# Patient Record
Sex: Female | Born: 1935 | Race: White | Hispanic: No | Smoking: Never smoker
Health system: Southern US, Community
[De-identification: ages and names within clinical notes are randomized; demographics above are authoritative.]

## PROBLEM LIST (undated history)

## (undated) DIAGNOSIS — E079 Disorder of thyroid, unspecified: Secondary | ICD-10-CM

---

## 2004-11-03 ENCOUNTER — Other Ambulatory Visit: Payer: Self-pay

## 2004-11-03 ENCOUNTER — Emergency Department: Payer: Self-pay | Admitting: Emergency Medicine

## 2005-06-29 ENCOUNTER — Inpatient Hospital Stay: Payer: Self-pay | Admitting: Internal Medicine

## 2005-06-29 ENCOUNTER — Other Ambulatory Visit: Payer: Self-pay

## 2005-07-02 ENCOUNTER — Other Ambulatory Visit: Payer: Self-pay

## 2005-07-04 ENCOUNTER — Other Ambulatory Visit: Payer: Self-pay

## 2005-09-27 ENCOUNTER — Emergency Department: Payer: Self-pay | Admitting: Emergency Medicine

## 2005-09-27 ENCOUNTER — Other Ambulatory Visit: Payer: Self-pay

## 2006-12-19 ENCOUNTER — Other Ambulatory Visit: Payer: Self-pay

## 2006-12-19 ENCOUNTER — Ambulatory Visit: Payer: Self-pay | Admitting: Internal Medicine

## 2007-01-22 ENCOUNTER — Other Ambulatory Visit: Payer: Self-pay

## 2007-01-22 ENCOUNTER — Emergency Department: Payer: Self-pay | Admitting: Emergency Medicine

## 2007-07-23 ENCOUNTER — Ambulatory Visit: Payer: Self-pay | Admitting: Family Medicine

## 2007-08-29 ENCOUNTER — Other Ambulatory Visit: Payer: Self-pay

## 2007-08-29 ENCOUNTER — Emergency Department: Payer: Self-pay | Admitting: Emergency Medicine

## 2007-12-09 ENCOUNTER — Other Ambulatory Visit: Payer: Self-pay

## 2007-12-09 ENCOUNTER — Ambulatory Visit: Payer: Self-pay | Admitting: Internal Medicine

## 2007-12-18 ENCOUNTER — Emergency Department: Payer: Self-pay | Admitting: Emergency Medicine

## 2008-02-06 ENCOUNTER — Ambulatory Visit: Payer: Self-pay | Admitting: Internal Medicine

## 2008-02-06 ENCOUNTER — Other Ambulatory Visit: Payer: Self-pay

## 2008-03-03 ENCOUNTER — Ambulatory Visit: Payer: Self-pay | Admitting: Family Medicine

## 2008-03-07 ENCOUNTER — Emergency Department: Payer: Self-pay | Admitting: Emergency Medicine

## 2008-06-29 ENCOUNTER — Emergency Department: Payer: Self-pay | Admitting: Emergency Medicine

## 2008-08-10 ENCOUNTER — Emergency Department: Payer: Self-pay | Admitting: Internal Medicine

## 2009-01-19 ENCOUNTER — Inpatient Hospital Stay: Payer: Self-pay | Admitting: Internal Medicine

## 2009-07-25 ENCOUNTER — Emergency Department: Payer: Self-pay | Admitting: Emergency Medicine

## 2009-08-28 ENCOUNTER — Inpatient Hospital Stay: Payer: Self-pay | Admitting: Internal Medicine

## 2009-12-20 ENCOUNTER — Observation Stay: Payer: Self-pay | Admitting: Internal Medicine

## 2010-01-17 ENCOUNTER — Ambulatory Visit: Payer: Self-pay | Admitting: Internal Medicine

## 2010-01-17 ENCOUNTER — Observation Stay: Payer: Self-pay | Admitting: Internal Medicine

## 2010-02-16 ENCOUNTER — Ambulatory Visit: Payer: Self-pay | Admitting: Internal Medicine

## 2010-04-07 ENCOUNTER — Emergency Department: Payer: Self-pay | Admitting: Emergency Medicine

## 2010-04-19 ENCOUNTER — Ambulatory Visit: Payer: Self-pay | Admitting: Internal Medicine

## 2010-06-27 ENCOUNTER — Emergency Department: Payer: Self-pay | Admitting: Emergency Medicine

## 2011-04-14 IMAGING — CR DG CHEST 1V PORT
1 series · 1 of 1 positions shown · non-contrast
Comparison: none

REASON FOR EXAM: weakness
COMMENTS:

PROCEDURE:     DXR - DXR PORTABLE CHEST SINGLE VIEW  - January 19, 2009 [DATE]
RESULT:     Comparison: 06/29/2008

[view not recorded]
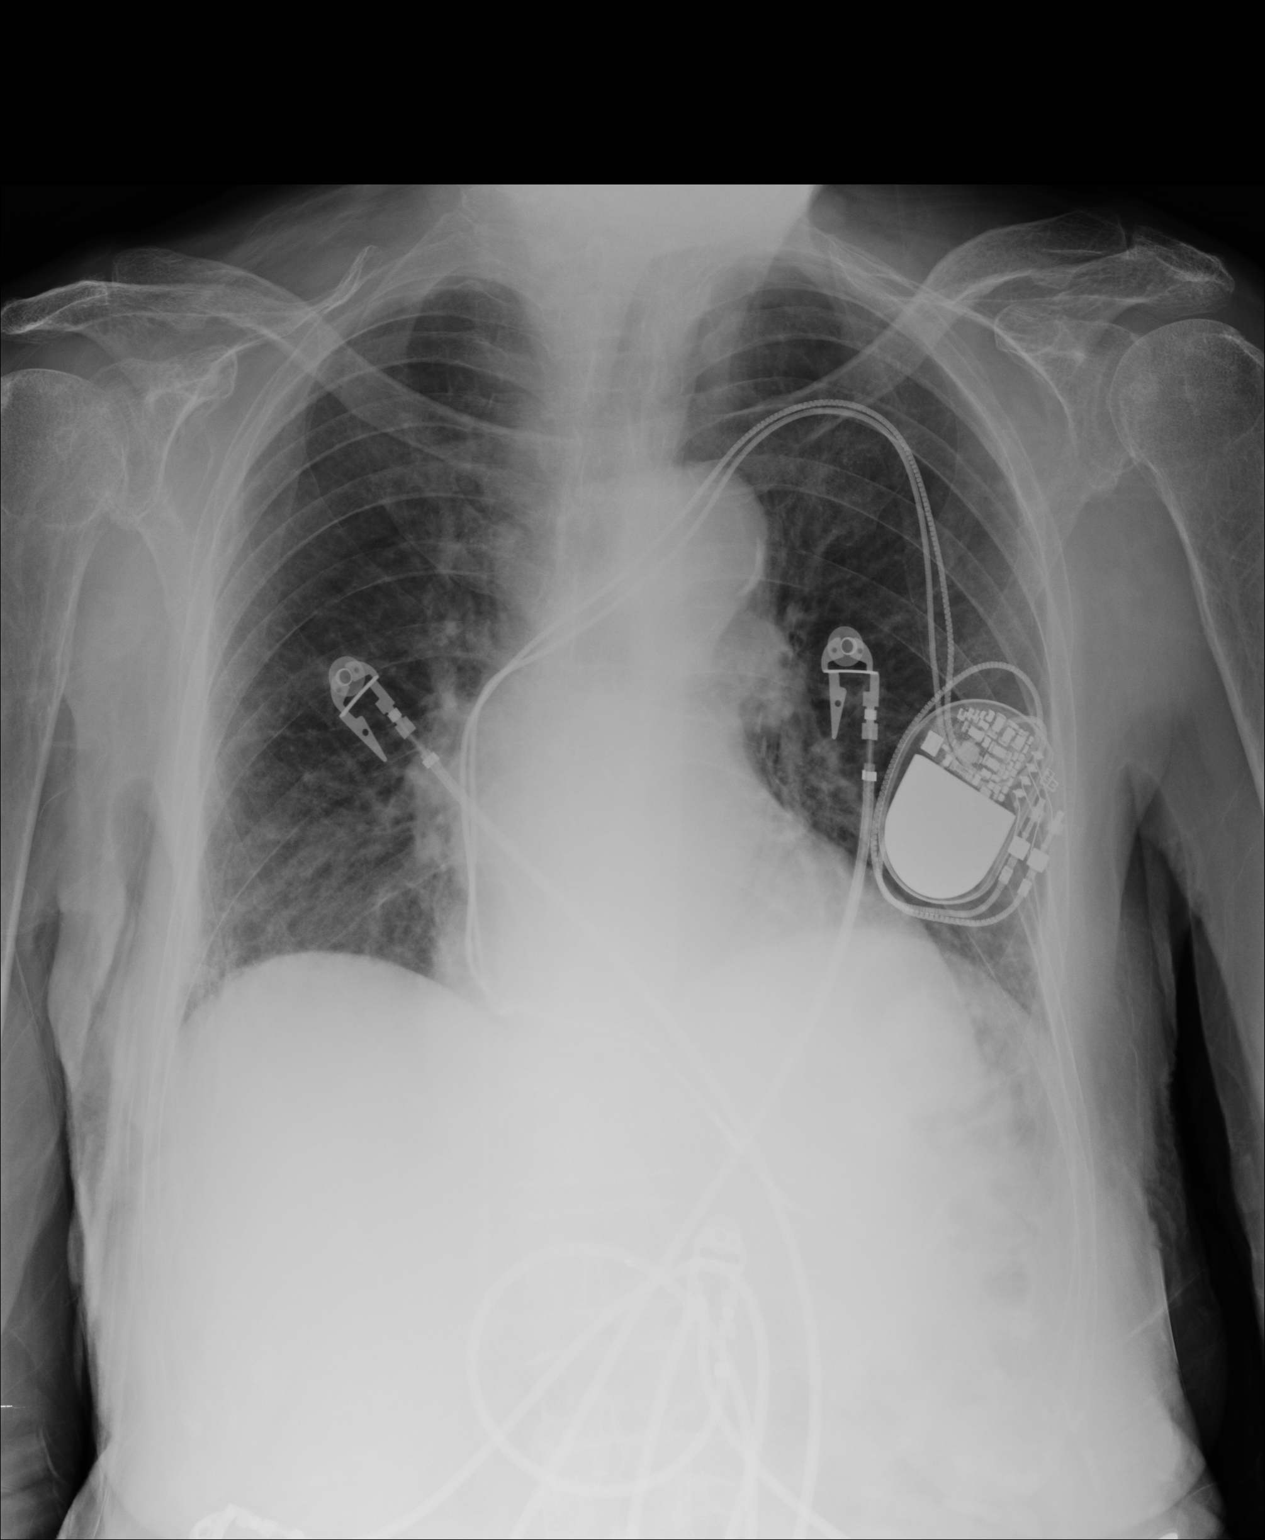

[1 of 1 positions shown; findings below may reference images not displayed]

FINDINGS: Single portable AP chest radiograph is provided. There is no focal
parenchymal opacity, pleural effusion, or pneumothorax. There is a dual-lead
cardiac pacer noted. Normal cardiomediastinal silhouette. The osseous
structures are unremarkable.
IMPRESSION: No acute disease of the chest.

## 2011-07-07 ENCOUNTER — Emergency Department: Payer: Self-pay | Admitting: Emergency Medicine

## 2011-07-07 LAB — COMPREHENSIVE METABOLIC PANEL
Albumin: 3.5 g/dL (ref 3.4–5.0)
Anion Gap: 11 (ref 7–16)
Calcium, Total: 8.5 mg/dL (ref 8.5–10.1)
Chloride: 108 mmol/L — ABNORMAL HIGH (ref 98–107)
Co2: 24 mmol/L (ref 21–32)
Osmolality: 287 (ref 275–301)
Potassium: 4.2 mmol/L (ref 3.5–5.1)
Sodium: 143 mmol/L (ref 136–145)

## 2011-07-07 LAB — TROPONIN I: Troponin-I: <0.02 ng/mL

## 2011-07-07 LAB — CBC
HCT: 34.5 % — ABNORMAL LOW (ref 35.0–47.0)
HGB: 11.4 g/dL — ABNORMAL LOW (ref 12.0–16.0)
MCH: 32.3 pg (ref 26.0–34.0)
MCHC: 33.2 g/dL (ref 32.0–36.0)
MCV: 97 fL (ref 80–100)
Platelet: 154 10*3/uL (ref 150–440)
RDW: 13.5 % (ref 11.5–14.5)

## 2011-07-08 LAB — URINALYSIS, COMPLETE
Bacteria: NONE SEEN
Bilirubin,UR: NEGATIVE
Glucose,UR: NEGATIVE mg/dL (ref 0–75)
Ketone: NEGATIVE
Leukocyte Esterase: NEGATIVE
RBC,UR: <1 /HPF (ref 0–5)
Specific Gravity: 1.021 (ref 1.003–1.030)
Squamous Epithelial: NONE SEEN

## 2011-10-29 ENCOUNTER — Emergency Department: Payer: Self-pay | Admitting: Unknown Physician Specialty

## 2011-10-29 LAB — CBC
HGB: 11 g/dL — ABNORMAL LOW (ref 12.0–16.0)
MCHC: 33.5 g/dL (ref 32.0–36.0)
MCV: 96 fL (ref 80–100)
RBC: 3.42 10*6/uL — ABNORMAL LOW (ref 3.80–5.20)
RDW: 13.7 % (ref 11.5–14.5)
WBC: 4.2 10*3/uL (ref 3.6–11.0)

## 2011-10-29 LAB — URINALYSIS, COMPLETE
Blood: NEGATIVE
Glucose,UR: NEGATIVE mg/dL (ref 0–75)
Ketone: NEGATIVE
Leukocyte Esterase: NEGATIVE
Nitrite: NEGATIVE
RBC,UR: NONE SEEN /HPF (ref 0–5)
Specific Gravity: 1.006 (ref 1.003–1.030)
Squamous Epithelial: NONE SEEN
WBC UR: <1 /HPF (ref 0–5)

## 2011-10-29 LAB — HEPATIC FUNCTION PANEL A (ARMC)
Alkaline Phosphatase: 73 U/L (ref 50–136)
Bilirubin, Direct: <0.1 mg/dL (ref 0.00–0.20)
Bilirubin,Total: 0.3 mg/dL (ref 0.2–1.0)
Total Protein: 6.2 g/dL — ABNORMAL LOW (ref 6.4–8.2)

## 2011-10-29 LAB — BASIC METABOLIC PANEL
Anion Gap: 7 (ref 7–16)
BUN: 12 mg/dL (ref 7–18)
Creatinine: 0.73 mg/dL (ref 0.60–1.30)
EGFR (African American): >60
EGFR (Non-African Amer.): >60
Glucose: 99 mg/dL (ref 65–99)
Potassium: 3.7 mmol/L (ref 3.5–5.1)
Sodium: 144 mmol/L (ref 136–145)

## 2011-10-29 LAB — LIPASE, BLOOD: Lipase: 105 U/L (ref 73–393)

## 2011-10-31 LAB — URINE CULTURE

## 2013-01-09 ENCOUNTER — Ambulatory Visit: Payer: Self-pay

## 2013-01-09 LAB — URINALYSIS, COMPLETE
Bacteria: NEGATIVE
Bilirubin,UR: NEGATIVE
Blood: NEGATIVE
Glucose,UR: NEGATIVE mg/dL (ref 0–75)
Ketone: NEGATIVE
Nitrite: NEGATIVE
Ph: 7 (ref 4.5–8.0)
Specific Gravity: 1.015 (ref 1.003–1.030)

## 2013-01-15 ENCOUNTER — Emergency Department: Payer: Self-pay | Admitting: Emergency Medicine

## 2013-01-15 LAB — COMPREHENSIVE METABOLIC PANEL
Albumin: 3.6 g/dL (ref 3.4–5.0)
Alkaline Phosphatase: 65 U/L (ref 50–136)
Anion Gap: 6 — ABNORMAL LOW (ref 7–16)
Co2: 27 mmol/L (ref 21–32)
EGFR (African American): >60
EGFR (Non-African Amer.): 60 — ABNORMAL LOW
Glucose: 103 mg/dL — ABNORMAL HIGH (ref 65–99)
Osmolality: 278 (ref 275–301)
SGPT (ALT): 16 U/L (ref 12–78)
Sodium: 139 mmol/L (ref 136–145)
Total Protein: 6.9 g/dL (ref 6.4–8.2)

## 2013-01-15 LAB — CBC
HGB: 11.1 g/dL — ABNORMAL LOW (ref 12.0–16.0)
MCH: 30.9 pg (ref 26.0–34.0)
MCHC: 33.5 g/dL (ref 32.0–36.0)
MCV: 92 fL (ref 80–100)
RBC: 3.6 10*6/uL — ABNORMAL LOW (ref 4.40–5.90)
RDW: 14.4 % (ref 11.5–14.5)
WBC: 4.6 10*3/uL (ref 3.8–10.6)

## 2013-01-15 LAB — URINALYSIS, COMPLETE
Bacteria: NONE SEEN
Blood: NEGATIVE
Glucose,UR: NEGATIVE mg/dL (ref 0–75)

## 2013-01-15 LAB — TROPONIN I: Troponin-I: <0.02 ng/mL

## 2013-02-04 ENCOUNTER — Ambulatory Visit: Payer: Self-pay | Admitting: Internal Medicine

## 2013-03-12 ENCOUNTER — Observation Stay: Payer: Self-pay | Admitting: Internal Medicine

## 2013-03-12 LAB — COMPREHENSIVE METABOLIC PANEL
Alkaline Phosphatase: 72 U/L (ref 50–136)
Bilirubin,Total: 0.4 mg/dL (ref 0.2–1.0)
Calcium, Total: 8.8 mg/dL (ref 8.5–10.1)
Chloride: 110 mmol/L — ABNORMAL HIGH (ref 98–107)
Co2: 26 mmol/L (ref 21–32)
Creatinine: 0.9 mg/dL (ref 0.60–1.30)
EGFR (Non-African Amer.): 52 — ABNORMAL LOW
Glucose: 102 mg/dL — ABNORMAL HIGH (ref 65–99)
Osmolality: 282 (ref 275–301)
Potassium: 3.9 mmol/L (ref 3.5–5.1)
Sodium: 141 mmol/L (ref 136–145)
Total Protein: 6.5 g/dL (ref 6.4–8.2)

## 2013-03-12 LAB — URINALYSIS, COMPLETE
Bilirubin,UR: NEGATIVE
Ketone: NEGATIVE
Leukocyte Esterase: NEGATIVE
Nitrite: NEGATIVE
Ph: 6 (ref 4.5–8.0)
Protein: NEGATIVE
RBC,UR: 2 /HPF (ref 0–5)
Squamous Epithelial: NONE SEEN

## 2013-03-12 LAB — CBC
HGB: 10.4 g/dL — ABNORMAL LOW (ref 12.0–16.0)
MCH: 31.1 pg (ref 26.0–34.0)
MCHC: 33.6 g/dL (ref 32.0–36.0)
MCV: 93 fL (ref 80–100)
RBC: 3.34 10*6/uL — ABNORMAL LOW (ref 4.40–5.90)
WBC: 5.4 10*3/uL (ref 3.8–10.6)

## 2013-03-12 LAB — TSH: Thyroid Stimulating Horm: 1.7 u[IU]/mL

## 2013-03-12 LAB — TROPONIN I: Troponin-I: <0.02 ng/mL

## 2013-04-11 ENCOUNTER — Emergency Department: Payer: Self-pay | Admitting: Emergency Medicine

## 2013-04-11 LAB — URINALYSIS, COMPLETE
Ketone: NEGATIVE
Leukocyte Esterase: NEGATIVE
Ph: 7 (ref 4.5–8.0)
Squamous Epithelial: <1

## 2013-04-11 LAB — CBC
HCT: 28.7 % — ABNORMAL LOW (ref 40.0–52.0)
HGB: 9.7 g/dL — ABNORMAL LOW (ref 12.0–16.0)
MCH: 31 pg (ref 26.0–34.0)
MCHC: 33.6 g/dL (ref 32.0–36.0)
MCV: 92 fL (ref 80–100)
Platelet: 136 10*3/uL — ABNORMAL LOW (ref 150–440)
RDW: 14.6 % — ABNORMAL HIGH (ref 11.5–14.5)

## 2013-04-11 LAB — BASIC METABOLIC PANEL
Anion Gap: 6 — ABNORMAL LOW (ref 7–16)
Calcium, Total: 8.9 mg/dL (ref 8.5–10.1)
Chloride: 108 mmol/L — ABNORMAL HIGH (ref 98–107)
Co2: 28 mmol/L (ref 21–32)
EGFR (Non-African Amer.): >60

## 2013-04-11 LAB — TSH: Thyroid Stimulating Horm: 2.52 u[IU]/mL

## 2013-04-11 LAB — TROPONIN I: Troponin-I: <0.02 ng/mL

## 2013-04-11 LAB — CK TOTAL AND CKMB (NOT AT ARMC): CK-MB: 2.7 ng/mL (ref 0.5–3.6)

## 2013-04-11 LAB — PRO B NATRIURETIC PEPTIDE: B-Type Natriuretic Peptide: 737 pg/mL — ABNORMAL HIGH (ref 0–125)

## 2013-05-14 ENCOUNTER — Observation Stay: Payer: Self-pay | Admitting: Orthopedic Surgery

## 2013-05-14 LAB — CBC
MCV: 94 fL (ref 80–100)
RDW: 15.1 % — ABNORMAL HIGH (ref 11.5–14.5)

## 2013-05-14 LAB — URINALYSIS, COMPLETE
Bilirubin,UR: NEGATIVE
Blood: NEGATIVE
Ph: 7 (ref 4.5–8.0)
Protein: NEGATIVE
RBC,UR: <1 /HPF (ref 0–5)
Squamous Epithelial: <1
WBC UR: 2 /HPF (ref 0–5)

## 2013-05-14 LAB — COMPREHENSIVE METABOLIC PANEL
Albumin: 3.5 g/dL (ref 3.4–5.0)
Alkaline Phosphatase: 73 U/L
Anion Gap: 3 — ABNORMAL LOW (ref 7–16)
BUN: 16 mg/dL (ref 7–18)
Bilirubin,Total: 0.8 mg/dL (ref 0.2–1.0)
EGFR (Non-African Amer.): >60
SGOT(AST): 35 U/L (ref 15–37)
SGPT (ALT): 26 U/L (ref 12–78)
Total Protein: 7 g/dL (ref 6.4–8.2)

## 2013-05-15 LAB — CBC WITH DIFFERENTIAL/PLATELET
Basophil %: 0.3 %
Eosinophil #: 0 10*3/uL (ref 0.0–0.7)
HCT: 29 % — ABNORMAL LOW (ref 40.0–52.0)
Lymphocyte %: 5.5 %
MCHC: 34.1 g/dL (ref 32.0–36.0)
Neutrophil %: 89.2 %
Platelet: 132 10*3/uL — ABNORMAL LOW (ref 150–440)
RBC: 3.14 10*6/uL — ABNORMAL LOW (ref 4.40–5.90)
WBC: 6.9 10*3/uL (ref 3.8–10.6)

## 2013-05-15 LAB — BASIC METABOLIC PANEL
Anion Gap: 3 — ABNORMAL LOW (ref 7–16)
BUN: 14 mg/dL (ref 7–18)
Calcium, Total: 8.5 mg/dL (ref 8.5–10.1)
EGFR (African American): >60
EGFR (Non-African Amer.): >60
Glucose: 115 mg/dL — ABNORMAL HIGH (ref 65–99)
Osmolality: 275 (ref 275–301)

## 2013-05-16 LAB — URINE CULTURE

## 2013-05-29 ENCOUNTER — Emergency Department: Payer: Self-pay | Admitting: Emergency Medicine

## 2013-05-31 ENCOUNTER — Emergency Department: Payer: Self-pay | Admitting: Emergency Medicine

## 2013-09-10 ENCOUNTER — Emergency Department: Payer: Self-pay | Admitting: Emergency Medicine

## 2013-09-10 LAB — URINALYSIS, COMPLETE
BACTERIA: NONE SEEN
Bilirubin,UR: NEGATIVE
Blood: NEGATIVE
Glucose,UR: NEGATIVE mg/dL (ref 0–75)
Ketone: NEGATIVE
LEUKOCYTE ESTERASE: NEGATIVE
Nitrite: NEGATIVE
Ph: 6 (ref 4.5–8.0)
Protein: NEGATIVE
RBC, UR: NONE SEEN /HPF (ref 0–5)
Specific Gravity: 1.014 (ref 1.003–1.030)
Squamous Epithelial: <1

## 2013-09-10 LAB — CBC
HCT: 31.2 % — AB (ref 40.0–52.0)
HGB: 10.3 g/dL — ABNORMAL LOW (ref 12.0–16.0)
MCH: 32.3 pg (ref 26.0–34.0)
MCHC: 33.1 g/dL (ref 32.0–36.0)
MCV: 98 fL (ref 80–100)
Platelet: 140 10*3/uL — ABNORMAL LOW (ref 150–440)
RBC: 3.2 10*6/uL — AB (ref 4.40–5.90)
RDW: 13.5 % (ref 11.5–14.5)
WBC: 5.7 10*3/uL (ref 3.8–10.6)

## 2013-09-10 LAB — BASIC METABOLIC PANEL
ANION GAP: 7 (ref 7–16)
BUN: 19 mg/dL — ABNORMAL HIGH (ref 7–18)
Calcium, Total: 9.3 mg/dL (ref 8.5–10.1)
Chloride: 108 mmol/L — ABNORMAL HIGH (ref 98–107)
Co2: 29 mmol/L (ref 21–32)
Creatinine: 0.78 mg/dL (ref 0.60–1.30)
EGFR (Non-African Amer.): >60
Glucose: 106 mg/dL — ABNORMAL HIGH (ref 65–99)
Osmolality: 290 (ref 275–301)
Potassium: 3.6 mmol/L (ref 3.5–5.1)
SODIUM: 144 mmol/L (ref 136–145)

## 2013-11-30 ENCOUNTER — Emergency Department: Payer: Self-pay | Admitting: Internal Medicine

## 2013-11-30 LAB — URINALYSIS, COMPLETE
BILIRUBIN, UR: NEGATIVE
Glucose,UR: NEGATIVE mg/dL (ref 0–75)
LEUKOCYTE ESTERASE: NEGATIVE
NITRITE: NEGATIVE
Ph: 5 (ref 4.5–8.0)
Protein: 30
Specific Gravity: 1.021 (ref 1.003–1.030)

## 2013-11-30 LAB — COMPREHENSIVE METABOLIC PANEL
ALBUMIN: 3.3 g/dL — AB (ref 3.4–5.0)
ALK PHOS: 68 U/L
ALT: 23 U/L (ref 12–78)
Anion Gap: 7 (ref 7–16)
BILIRUBIN TOTAL: 1.8 mg/dL — AB (ref 0.2–1.0)
BUN: 22 mg/dL — AB (ref 7–18)
CHLORIDE: 108 mmol/L — AB (ref 98–107)
CREATININE: 1.06 mg/dL (ref 0.60–1.30)
Calcium, Total: 8.8 mg/dL (ref 8.5–10.1)
Co2: 25 mmol/L (ref 21–32)
EGFR (Non-African Amer.): 43 — ABNORMAL LOW
GFR CALC AF AMER: 50 — AB
Glucose: 114 mg/dL — ABNORMAL HIGH (ref 65–99)
OSMOLALITY: 284 (ref 275–301)
Potassium: 3.5 mmol/L (ref 3.5–5.1)
SGOT(AST): 36 U/L (ref 15–37)
SODIUM: 140 mmol/L (ref 136–145)
Total Protein: 7.2 g/dL (ref 6.4–8.2)

## 2013-11-30 LAB — CBC
HCT: 27.5 % — AB (ref 40.0–52.0)
HGB: 9.1 g/dL — AB (ref 12.0–16.0)
MCH: 31.6 pg (ref 26.0–34.0)
MCHC: 33.3 g/dL (ref 32.0–36.0)
MCV: 95 fL (ref 80–100)
Platelet: 123 10*3/uL — ABNORMAL LOW (ref 150–440)
RBC: 2.89 10*6/uL — ABNORMAL LOW (ref 4.40–5.90)
RDW: 14.8 % — AB (ref 11.5–14.5)
WBC: 7.2 10*3/uL (ref 3.8–10.6)

## 2013-11-30 LAB — CK TOTAL AND CKMB (NOT AT ARMC)
CK, Total: 305 U/L
CK-MB: 2.8 ng/mL (ref 0.5–3.6)

## 2013-11-30 LAB — TROPONIN I: TROPONIN-I: 0.02 ng/mL

## 2013-12-13 ENCOUNTER — Ambulatory Visit: Payer: Self-pay | Admitting: Internal Medicine

## 2013-12-13 LAB — CBC CANCER CENTER
BASOS PCT: 0.7 %
Basophil #: 0 x10 3/mm (ref 0.0–0.1)
EOS PCT: 2.6 %
Eosinophil #: 0.1 x10 3/mm (ref 0.0–0.7)
HCT: 27.1 % — AB (ref 40.0–52.0)
HGB: 8.7 g/dL — ABNORMAL LOW (ref 12.0–16.0)
Lymphocyte #: 1.1 x10 3/mm (ref 1.0–3.6)
Lymphocyte %: 22.9 %
MCH: 30.7 pg (ref 26.0–34.0)
MCHC: 32.1 g/dL (ref 32.0–36.0)
MCV: 96 fL (ref 80–100)
MONOS PCT: 10.7 %
Monocyte #: 0.5 x10 3/mm (ref 0.2–1.0)
NEUTROS ABS: 2.9 x10 3/mm (ref 1.4–6.5)
NEUTROS PCT: 63.1 %
Platelet: 293 x10 3/mm (ref 150–440)
RBC: 2.83 10*6/uL — ABNORMAL LOW (ref 4.40–5.90)
RDW: 15.2 % — ABNORMAL HIGH (ref 11.5–14.5)
WBC: 4.5 x10 3/mm (ref 3.8–10.6)

## 2013-12-13 LAB — HEPATIC FUNCTION PANEL A (ARMC)
ALK PHOS: 143 U/L — AB
AST: 23 U/L (ref 15–37)
Albumin: 2.8 g/dL — ABNORMAL LOW (ref 3.4–5.0)
BILIRUBIN DIRECT: 0.1 mg/dL (ref 0.00–0.30)
Bilirubin,Total: 0.5 mg/dL (ref 0.2–1.0)
SGPT (ALT): 14 U/L
Total Protein: 7 g/dL (ref 6.4–8.2)

## 2013-12-13 LAB — IRON AND TIBC
IRON BIND. CAP.(TOTAL): 219 ug/dL — AB (ref 250–450)
Iron Saturation: 9 %
Iron: 20 ug/dL — ABNORMAL LOW (ref 50–175)
UNBOUND IRON-BIND. CAP.: 199 ug/dL

## 2013-12-13 LAB — RETICULOCYTES
Absolute Retic Count: 0.032 10*6/uL (ref 0.019–0.186)
RETICULOCYTE: 1.1 % (ref 0.4–3.1)

## 2013-12-13 LAB — FERRITIN: FERRITIN (ARMC): 67 ng/mL (ref 8–388)

## 2013-12-15 LAB — PROT IMMUNOELECTROPHORES(ARMC)

## 2013-12-15 LAB — KAPPA/LAMBDA FREE LIGHT CHAINS (ARMC)

## 2013-12-17 ENCOUNTER — Ambulatory Visit: Payer: Self-pay | Admitting: Internal Medicine

## 2013-12-27 LAB — IRON AND TIBC
IRON SATURATION: 14 %
IRON: 33 ug/dL — AB (ref 50–175)
Iron Bind.Cap.(Total): 236 ug/dL — ABNORMAL LOW (ref 250–450)
Unbound Iron-Bind.Cap.: 203 ug/dL

## 2013-12-27 LAB — CANCER CENTER HEMOGLOBIN: HGB: 9.7 g/dL — AB (ref 12.0–16.0)

## 2014-01-17 ENCOUNTER — Ambulatory Visit: Payer: Self-pay | Admitting: Internal Medicine

## 2014-03-22 ENCOUNTER — Ambulatory Visit: Payer: Self-pay | Admitting: Internal Medicine

## 2014-04-15 LAB — BASIC METABOLIC PANEL
Anion Gap: 4 — ABNORMAL LOW (ref 7–16)
BUN: 11 mg/dL (ref 7–18)
CALCIUM: 8.3 mg/dL — AB (ref 8.5–10.1)
CHLORIDE: 103 mmol/L (ref 98–107)
Co2: 29 mmol/L (ref 21–32)
Creatinine: 0.85 mg/dL (ref 0.60–1.30)
Glucose: 107 mg/dL — ABNORMAL HIGH (ref 65–99)
Osmolality: 272 (ref 275–301)
Potassium: 4.1 mmol/L (ref 3.5–5.1)
SODIUM: 136 mmol/L (ref 136–145)

## 2014-04-15 LAB — CBC
HCT: 30 % — ABNORMAL LOW (ref 40.0–52.0)
HGB: 9.8 g/dL — AB (ref 12.0–16.0)
MCH: 31.9 pg (ref 26.0–34.0)
MCHC: 32.7 g/dL (ref 32.0–36.0)
MCV: 97 fL (ref 80–100)
PLATELETS: 209 10*3/uL (ref 150–440)
RBC: 3.08 10*6/uL — ABNORMAL LOW (ref 4.40–5.90)
RDW: 14.4 % (ref 11.5–14.5)
WBC: 4.5 10*3/uL (ref 3.8–10.6)

## 2014-04-15 LAB — TROPONIN I

## 2014-04-15 LAB — PRO B NATRIURETIC PEPTIDE: B-Type Natriuretic Peptide: 1438 pg/mL — ABNORMAL HIGH (ref 0–125)

## 2014-04-16 ENCOUNTER — Inpatient Hospital Stay: Payer: Self-pay | Admitting: Internal Medicine

## 2014-04-16 LAB — URINALYSIS, COMPLETE
BILIRUBIN, UR: NEGATIVE
BLOOD: NEGATIVE
Glucose,UR: NEGATIVE mg/dL (ref 0–75)
KETONE: NEGATIVE
LEUKOCYTE ESTERASE: NEGATIVE
NITRITE: NEGATIVE
Ph: 6 (ref 4.5–8.0)
Protein: NEGATIVE
SPECIFIC GRAVITY: 1.011 (ref 1.003–1.030)

## 2014-04-17 LAB — CBC WITH DIFFERENTIAL/PLATELET
BASOS ABS: 0 10*3/uL (ref 0.0–0.1)
BASOS PCT: 0.6 %
EOS ABS: 0 10*3/uL (ref 0.0–0.7)
Eosinophil %: 0.4 %
HCT: 29.7 % — AB (ref 40.0–52.0)
HGB: 9.6 g/dL — AB (ref 12.0–16.0)
LYMPHS ABS: 0.7 10*3/uL — AB (ref 1.0–3.6)
Lymphocyte %: 12.8 %
MCH: 31.7 pg (ref 26.0–34.0)
MCHC: 32.4 g/dL (ref 32.0–36.0)
MCV: 98 fL (ref 80–100)
MONOS PCT: 8.5 %
Monocyte #: 0.5 10*3/uL (ref 0.2–1.0)
NEUTROS PCT: 77.7 %
Neutrophil #: 4.3 10*3/uL (ref 1.4–6.5)
Platelet: 196 10*3/uL (ref 150–440)
RBC: 3.03 10*6/uL — ABNORMAL LOW (ref 4.40–5.90)
RDW: 14.4 % (ref 11.5–14.5)
WBC: 5.6 10*3/uL (ref 3.8–10.6)

## 2014-04-21 LAB — CULTURE, BLOOD (SINGLE)

## 2014-05-01 ENCOUNTER — Inpatient Hospital Stay: Payer: Self-pay | Admitting: Internal Medicine

## 2014-05-01 LAB — CBC
HCT: 31 % — ABNORMAL LOW (ref 40.0–52.0)
HGB: 10 g/dL — ABNORMAL LOW (ref 12.0–16.0)
MCH: 32 pg (ref 26.0–34.0)
MCHC: 32.3 g/dL (ref 32.0–36.0)
MCV: 99 fL (ref 80–100)
Platelet: 156 10*3/uL (ref 150–440)
RBC: 3.13 10*6/uL — ABNORMAL LOW (ref 4.40–5.90)
RDW: 15 % — ABNORMAL HIGH (ref 11.5–14.5)
WBC: 3.6 10*3/uL — AB (ref 3.8–10.6)

## 2014-05-01 LAB — COMPREHENSIVE METABOLIC PANEL
ALT: 18 U/L
Albumin: 3.2 g/dL — ABNORMAL LOW (ref 3.4–5.0)
Alkaline Phosphatase: 85 U/L
Anion Gap: 2 — ABNORMAL LOW (ref 7–16)
BILIRUBIN TOTAL: 0.5 mg/dL (ref 0.2–1.0)
BUN: 18 mg/dL (ref 7–18)
CHLORIDE: 104 mmol/L (ref 98–107)
Calcium, Total: 8.4 mg/dL — ABNORMAL LOW (ref 8.5–10.1)
Co2: 33 mmol/L — ABNORMAL HIGH (ref 21–32)
Creatinine: 0.94 mg/dL (ref 0.60–1.30)
EGFR (African American): >60
EGFR (Non-African Amer.): 58 — ABNORMAL LOW
Glucose: 96 mg/dL (ref 65–99)
OSMOLALITY: 279 (ref 275–301)
Potassium: 4.4 mmol/L (ref 3.5–5.1)
SGOT(AST): 26 U/L (ref 15–37)
Sodium: 139 mmol/L (ref 136–145)
Total Protein: 6.9 g/dL (ref 6.4–8.2)

## 2014-05-01 LAB — TROPONIN I
Troponin-I: <0.02 ng/mL
Troponin-I: <0.02 ng/mL

## 2014-05-01 LAB — PRO B NATRIURETIC PEPTIDE: B-Type Natriuretic Peptide: 2010 pg/mL — ABNORMAL HIGH (ref 0–125)

## 2014-05-02 LAB — CBC WITH DIFFERENTIAL/PLATELET
BASOS PCT: 1 %
Basophil #: 0 10*3/uL (ref 0.0–0.1)
EOS PCT: 3 %
Eosinophil #: 0.1 10*3/uL (ref 0.0–0.7)
HCT: 29.8 % — ABNORMAL LOW (ref 40.0–52.0)
HGB: 9.7 g/dL — ABNORMAL LOW (ref 12.0–16.0)
Lymphocyte #: 0.7 10*3/uL — ABNORMAL LOW (ref 1.0–3.6)
Lymphocyte %: 19.5 %
MCH: 32 pg (ref 26.0–34.0)
MCHC: 32.5 g/dL (ref 32.0–36.0)
MCV: 98 fL (ref 80–100)
Monocyte #: 0.4 10*3/uL (ref 0.2–1.0)
Monocyte %: 12.3 %
NEUTROS ABS: 2.2 10*3/uL (ref 1.4–6.5)
Neutrophil %: 64.2 %
Platelet: 154 10*3/uL (ref 150–440)
RBC: 3.03 10*6/uL — ABNORMAL LOW (ref 4.40–5.90)
RDW: 15.3 % — AB (ref 11.5–14.5)
WBC: 3.5 10*3/uL — ABNORMAL LOW (ref 3.8–10.6)

## 2014-05-02 LAB — LIPID PANEL
CHOLESTEROL: 104 mg/dL (ref 0–200)
HDL: 49 mg/dL (ref 40–60)
Ldl Cholesterol, Calc: 42 mg/dL (ref 0–100)
Triglycerides: 65 mg/dL (ref 0–200)
VLDL Cholesterol, Calc: 13 mg/dL (ref 5–40)

## 2014-05-02 LAB — BASIC METABOLIC PANEL
ANION GAP: 8 (ref 7–16)
BUN: 16 mg/dL (ref 7–18)
CHLORIDE: 102 mmol/L (ref 98–107)
Calcium, Total: 8.2 mg/dL — ABNORMAL LOW (ref 8.5–10.1)
Co2: 29 mmol/L (ref 21–32)
Creatinine: 0.96 mg/dL (ref 0.60–1.30)
EGFR (African American): >60
EGFR (Non-African Amer.): 57 — ABNORMAL LOW
Glucose: 101 mg/dL — ABNORMAL HIGH (ref 65–99)
OSMOLALITY: 279 (ref 275–301)
Potassium: 3.9 mmol/L (ref 3.5–5.1)
Sodium: 139 mmol/L (ref 136–145)

## 2014-05-02 LAB — TROPONIN I: Troponin-I: <0.02 ng/mL

## 2014-09-08 NOTE — Consult Note (Signed)
Brief Consult Note: Diagnosis: Minimal cognitive decline.   Patient was seen by consultant.   Consult note dictated.   Recommend further assessment or treatment.   Discussed with Attending MD.   Comments: [name] has no psychiatric past. There are minimal cognitive defficits. The patient does have the capacity to dercide about her discharge. No medications recommended.  Electronic Signatures: Kiara Sanders, Kiara Sanders (MD)  (Signed 27-Oct-14 16:34)  Authored: Brief Consult Note   Last Updated: 27-Oct-14 16:34 by Kiara Sanders, Kiara Sanders (MD)

## 2014-09-08 NOTE — H&P (Signed)
PATIENT NAME:  Kiara Sanders, BELCOURT MR#:  [id number] DATE OF BIRTH:  1911-01-31  DATE OF ADMISSION:  03/12/2013  PRIMARY CARE PHYSICIAN:  Dr. Harrington Challenger.  REFERRING PHYSICIAN:  Dr. Fanny Bien.  CHIEF COMPLAINT:  Weakness and a fall for one month.   HISTORY OF PRESENT ILLNESS:  A 81 year old Caucasian female with a history of hypertension, hypothyroidism, neuropathy, was sent to the ED by friends due to weakness for the past one month and a fall. The patient is alert, awake, oriented, in no acute distress. According to patient's friend, the patient has a right blind eye and a very difficulty hearing problem. The patient is living alone. The patient was placed in assisted living for four days, but the patient does not want to stay there. Otherwise the patient denies any other symptoms.   PAST MEDICAL HISTORY: Neuropathy, hypertension, hypothyroidism, sick sinus syndrome status post pacemaker, glaucoma, B12 deficiency, GERD, right eye blindness, and a hearing problem.   PAST SURGICAL HISTORY:  Right eye surgery for glaucoma, cataract removal, pacemaker placement.   SOCIAL HISTORY: Living alone. No smoking, alcohol drinking or drug.  FAMILY HISTORY: unknown.  REVIEW OF SYSTEMS:  CONSTITUTIONAL:  Denies any fever or chills. No headache or dizziness, but has generalized weakness and decreased appetite.  HEENT:  No double vision, blurring vision but has a right blind eye. Also has difficulty hearing with a hearing aid.   CARDIOVASCULAR:  No chest pain, palpitation, orthopnea, or nocturnal dyspnea. No leg edema.  PULMONARY:  No cough, sputum, shortness of breath, or hemoptysis.  GASTROINTESTINAL:  No abdominal pain, nausea, vomiting or diarrhea. No melena or bloody stool.  GENITOURINARY:  No dysuria, hematuria, or incontinence.  SKIN:  No rash or jaundice.  NEUROLOGIC:  No syncope, loss of consciousness or seizure.  HEMATOLOGY:  No easy bruising or bleeding.  ENDOCRINE:  No polyuria, polydipsia, heat or  cold intolerance.   ALLERGIES:  DOXYCYCLINE, MACRODANTIN, PENICILLIN, PRELONE, DILANTIN, XALATAN.   HOME MEDICATIONS:  1.  Synthroid 88 mcg p.o. daily.  2.  Cefdinir 300 mg one cap twice q.12 hours.  3.  Calcium 600+ D 600 mg - 200 units p.o. tablets one once a day.  4.  Aspirin 81 mg p.o. daily.  5.  Norvasc 2.5 mg p.o. at bedtime.   PHYSICAL EXAMINATION:  VITAL SIGNS:  Temperature 97.8, blood pressure 156/88, pulse 60, O2 saturation 96% on room air.  GENERAL:  The patient is alert, awake, oriented, in no acute distress.  HEENT:  The right eye is blind. Left eye pupil is round, equal, reactive to light. Dry oral mucosa. Clear oropharynx.  NECK:  Supple. No JVD or carotid bruit. No lymphadenopathy. No thyromegaly.  CARDIOVASCULAR:  S1, S2 regular rate and rhythm. There is a systolic murmur. No gallop.   PULMONARY:  Bilateral air entry. No wheezing or rales. No use of accessory muscle to breathe.  ABDOMEN:  Soft. No distention, no tenderness. No organomegaly. Bowel sounds present.  EXTREMITIES:  No edema, clubbing or cyanosis. No calf tenderness. Strong bilateral pedal pulses SKIN:  No rash or jaundice.  NEUROLOGY:  A and O x 3. No focal deficit. Power 4/5. Sensation intact.   LABORATORY, DIAGNOSTIC AND RADIOLOGICAL DATA:  Urinalysis is negative. WBC 5.4, hemoglobin 10.4, platelets 191. Troponin less than 0.02. Glucose 102, BUN 15, creatinine 0.9. Sodium 141, potassium 3.9, chloride 110, bicarb 26. TSH is 1.7. Chest x-ray:  Interstitial changes likely representing interstitial fibrosis. No acute cardiopulmonary disease.  EKG shows electronic atrial pacemaker  at 74 BPM.   IMPRESSIONS:  1.  Weakness.  2.  Fall.  3.  Dehydration.  4.  Hypertension.  5.  Hypothyroidism.  6.  Neuropathy.  7.  Anemia.   PLAN OF TREATMENT:  1.  The patient will be placed for observation. We will get a PT consult and a dietitian consult. We will request a Child psychotherapistsocial worker for possible skilled nursing facility  placement.  2.  We will continue aspirin, Norvasc.   3.  GI and DVT prophylaxis.  4.  Discussed the patient's condition and plan of treatment with the patient and patient's friend. The patient's friend said the patient's daughter will come tomorrow and according to the patient and the patient's friend, the patient has not a good relation with her daughter.  5.  Also discussed the patient's treatment plan with the ED physician, Dr. Fanny BienQuale, and case manager, Marcelino DusterMichelle.   TIME SPENT:  About 52 minutes.   ____________________________ Shaune PollackQing Izell Labat, MD qc:jm D: 03/12/2013 14:52:56 ET T: 03/12/2013 16:09:33 ET JOB#: [id number]  cc: Shaune PollackQing Aidenjames Heckmann, MD, <Dictator> Shaune PollackQING Early Ord MD ELECTRONICALLY SIGNED 03/12/2013 16:58

## 2014-09-08 NOTE — H&P (Signed)
PATIENT NAME:  Franne FortsFLOURNOY, Royce B MR#:  [id number] DATE OF BIRTH:  1911/05/10  DATE OF ADMISSION:  05/14/2013  REASON FOR ADMISSION:  Status post fall with compression fracture.   HISTORY OF PRESENT ILLNESS:  This is a very nice 81 year old female who has been recently hospitalized over here with weakness and fall on 03/12/2013.  Her primary care physician is Dr. Harrington Challengerhies and her referring physician here is Dr. Cyril LoosenKinner.  The patient comes today after a mechanical fall.  She was getting into the bathroom and could not go through with her walker, for what should put it aside, she tripped and fell on the feet of the walker, laid down on the floor.  She was down for quite a while because she could not get any help for what she end up pushing has [facility].  Her [facility] called the paramedics and they pick her up and brought here to the Emergency Department.  Here her x-rays did not show any hip fracture, but she had a compression fracture of T10.  The patient does not want or require any surgical management, but she is really wanting to get some pain relief and be treated.  We are not quite sure if she is going to be able to go back and live by herself as this is the second fall that she gets admitted for and she has significant pain.  Her pain is excruciating, 10 out of 10, whenever she moves and whenever she is not moving she can tolerate it.  The patient is admitted for control of her pain, PT and possible placement.  She cannot walk or move by herself without having significant pain, for what we cannot send her home.  She will need to go to a skilled nursing facility.   REVIEW OF SYSTEMS:  Unable to obtain a full review of systems with problems communicating.  The patient is a very poor historian.  At this moment every time that I talked to her and she moves she is in pain and she is very hard of hearing, not able to give me a full report.  She can tell me that she has not had any fever, no blurry vision.  No  tinnitus.  No respiratory problems.  No cardiovascular problems.  No chest pain or heart disease.  No nausea or vomiting.  Normal bowel movements.  No difficulty urinating, although her frequency has increased.  No polyuria or polydipsia.  No cold or heat intolerance, no easy bruising or bleeding.  No new rashes, no gout.  Positive severe back pain as mentioned above.  No numbness or tingling.  No anxiety or agitation.   PAST MEDICAL HISTORY:  1.  Neuropathy.  2.  Hypertension.  3.  Hypothyroidism.  4.  Sick sinus rhythm.  5.  Permanent pacemaker.  6.  Glaucoma.  7.  Vitamin B12 deficiency.  8.  GERD.  9.  Right eye blindness.  10.  Hearing problem.   PAST SURGICAL HISTORY:  Right eye surgery for glaucoma.  Cataract removal and also permanent pacemaker placement.   SOCIAL HISTORY:  The patient is very independent.  She lives by herself.  She actually does pretty well.  She has a helper, Erie NoeVanessa who is there daily, but she does not live there.  She does not smoke.  She does not drink.   FAMILY HISTORY:  Unknown and noncontributory.  She is not aware of any coronary artery disease in her family.   CURRENT MEDICATIONS:  1.  Aspirin 81 mg daily.  2.  Amlodipine 5 mg daily.  3.  Dry eye ophthalmic drops into the left eye as needed.   4.  Synthroid 88 mcg once daily.   5.  Calcium plus vitamin D once daily.   PHYSICAL EXAMINATION: VITAL SIGNS:  Blood pressure 145/72, pulse 64, respirations 18, temperature 98.1, oxygen saturation 93% on room air.  GENERAL:  The patient is alert, she is difficult to communicate with due to her hearing loss.   HEENT:  She is blind in the right eye.  Her left pupil is reactive to light and to accommodation.  Extraocular movements intact.  The patient is anicteric.  Normocephalic, atraumatic.  No oral lesions.  No oropharyngeal exudates.  Normal mucosa without drainage.  NECK:  Supple.  No JVD.  No thyromegaly.  No adenopathy.  No carotid bruits.   CARDIOVASCULAR:  Regular rate and rhythm.  No murmurs, rubs or gallops are appreciated.  LUNGS:  Clear without any wheezing or crepitus.  No use of accessory muscles.  ABDOMEN:  Soft, nontender, nondistended.  No hepatosplenomegaly.  No masses.  Bowel sounds positive.  GENITAL:  Negative for external lesions.  EXTREMITIES:  No cyanosis or clubbing.  Trace edema of the lower extremities, which is chronic and likely due to the amlodipine that she takes.  VASCULAR:  Pulses +2.  Capillary refill less than 3.  NEUROLOGIC:  Cranial nerves II through XII intact.  Strength is 5 out of 5 in all 4 extremities, but it hurts a lot when she moves even her arms or legs and the pain comes to her back.  PSYCHIATRIC:  No judgment problems.  The patient is alert, oriented x 3.  SKIN:  No rashes or petechiae.  Decreased turgor.  MUSCULOSKELETAL:  No joint deformity or joint swelling.  Positive tenderness to palpation at the level of T11 spinal process.  The patient has normal sensation of the lower extremities.    LABORATORY DATA:  Glucose is 120.  BUN 16, creatinine 0.72, potassium 3.6, chloride 109.  LFTs were within normal limits.  White count is 9.7, hemoglobin 11.1, platelet count 142.  Color of the urine is normal.  No signs of urinary tract infection.  Cervical spine, no acute pathology.  Multiple levels of ankyloses and facet joint disease.  CT of the head is normal without any acute pathology.  Lumbar spine, mild superior endplate compression fracture at the level of T11.  Thoracic spine, new lower thoracic spine at T11, but no other bony destruction.   ASSESSMENT AND PLAN:  A 81 year old female with history of hypothyroidism.  This is her second fall as she fell back in October.  Today, she had a compression fracture at the level of T11.  The patient is not able to walk.  She is very delicate, very debilitated.  She is 81 years old and not able to take care of herself at this moment with the severe pain that  she has.  The patient is not getting any pain medications as it is right now because she does not hurt whenever she is lying down.  She only hurts whenever she moves and her pain is 10 out of 10, for what she is not able to walk.  We are going to admit her to the hospital, place a Foley catheter due to difficulty with mobility.  We are going to start her on calcitonin.  We are going to get a PT evaluation and an evaluation  for placement as well.  The patient is again very delicate.  Her blood pressure is stable.  She is going to be on deep vein thrombosis prophylaxis with heparin and gastrointestinal prophylaxis with Protonix.  Pain medication add on for need of the patient with Norco and Tylenol.  She has mild anemia which looks okay for 81 years old.  Hemoglobin 11.1, might be just chronic disease and decreased platelets at 142.  This is likely consumptive due to the pain and old fracture.   TIME SPENT:  I spent about 45 minutes with this patient.   CODE STATUS:  THE PATIENT IS A DO NOT RESUSCITATE.    ____________________________ Felipa Furnace, MD rsg:ea D: 05/14/2013 19:56:19 ET T: 05/14/2013 20:19:38 ET JOB#: [id number]  cc: Felipa Furnace, MD, <Dictator> Cinque Begley Juanda Chance MD ELECTRONICALLY SIGNED 05/16/2013 0:55

## 2014-09-08 NOTE — Discharge Summary (Signed)
PATIENT NAME:  Kiara Sanders, Kiara Sanders MR#:  [id number] DATE OF BIRTH:  20-Jan-1911  DATE OF ADMISSION:  03/12/2013 DATE OF DISCHARGE:  03/15/2013  CONSULTANT: Dr. Kristine LineaJolanta Pucilowska from psychiatry.   PRIMARY CARE PHYSICIAN: Mickey Farberavid Thies, MD  CHIEF COMPLAINT: Status post fall, brought in by a friend.   DISCHARGE DIAGNOSES: 1.  Status post fall and deconditioning.  2.  Hypertension.  3.  History of neuropathy.  4.  History of sick sinus syndrome, status post pacemaker.  5.  Gastroesophageal reflux disease.  6.  Right eye blindness.  7.  Hard of hearing.  8.  History of vitamin B12 deficiency.  9.  History of glaucoma.   DISCHARGE MEDICATIONS:  1.  Amlodipine 5 mg daily. 2.  Synthroid 88 mcg 1 tab in the morning. 3.  Aspirin 81 mg daily. 4.  Calcium 600/200 units 1 tab once a day in the morning.  5.  Dry eye relief ophthalmic solution 1 drop into the left eye once a day at bedtime.   DISPOSITION: She will be going home with home health, PT, nurse, nurse aide and social work.   DIET: Low sodium.   ACTIVITY: As tolerated.   DISCHARGE FOLLOWUP: Please follow with PCP within 1 to 2 weeks.   CODE STATUS: The patient is FULL CODE.   HISTORY OF PRESENT ILLNESS AND HOSPITAL COURSE: For full details of H and P, please see the dictation on 10/25 by Dr. Imogene Burnhen, but briefly this is a pleasant 81 year old female who lives by herself and has been living by herself for a long time, history of hypertension and neuropathy who was sent in to the ED by her friend due to weakness for the past month and also a fall. There was so concern that the patient might not be able to take care of herself and the patient lives alone. Of note, the patient was recently placed in an assisted living facility and she was there for only about 4 days or so and she signed herself out. She likes to be independent. She was admitted to the hospitalist service for observation for weakness. PT was consulted and she did ambulate  fairly well with PT. She had an x-ray of the chest which showed interstitial changes likely representing interstitial fibrosis, but no acute cardiopulmonary disease. She was afebrile and had no significant leukocytosis. She had no significant electrolyte abnormalities and UA was not suggestive of infection. There is also some concern about the patient's competency as she was persistently asking about wanting to be on her own. She stated that she cooks for herself and does her own chores. She was seen by Dr. Jennet MaduroPucilowska from psychiatry who stated that the patient does indeed have capacity to make her own decisions. At this point, she is very eager to be discharged. I have discussed the case several times with the patient's daughter as well as case management and social work Haematologiststaff. We are attempting to help her in the best way possible at this point. We have arranged home health, PT, RN, nurse aide and also social work to follow with her, and the patient is agreeable to this, but above all the patient states that she does not want to stay and wants to go home. We have increased her amlodipine dose as blood pressures  were on the higher side.   PHYSICAL EXAMINATION: VITAL SIGNS: On the day of discharge, her temperature was 98.3, pulse 67, respiratory rate 8 to 18. Last blood pressure 174/68. The blood pressures  have been ranging over the last 2 days of 150s to 160s. O2 sat 97% on room air.  GENERAL: The patient is an elderly frail female, in no obvious distress, lying in bed.  HEENT: The patient does have right eye blindness. No artificial eye. Otherwise normocephalic, atraumatic.  LUNGS: Normal respiratory effort. Clear without wheezing, rhonchi or rales.  HEART: Normal S1, S2.  ABDOMEN: Soft, nontender.  EXTREMITIES: No significant edema.  PSYCH: She is awake, alert and oriented x 3.   She will be discharged as above.   TOTAL TIME SPENT: 35 minutes.  ____________________________ Krystal Eaton,  MD sa:sb D: 03/15/2013 14:59:35 ET T: 03/15/2013 15:30:34 ET JOB#: [id number]  cc: Krystal Eaton, MD, <Dictator> Neomia Dear. Harrington Challenger, MD Marcelle Smiling [facility] MD ELECTRONICALLY SIGNED 03/15/2013 16:00

## 2014-09-09 NOTE — H&P (Signed)
PATIENT NAME:  Kiara Sanders, Kiara Sanders MR#:  [id number] DATE OF BIRTH:  04-18-1911  DATE OF ADMISSION:  04/15/2014  REFERFranne FortsRING PHYSICIAN: Eartha Inchory R. York CeriseForbach, MD  PRIMARY CARE PHYSICIAN: Neomia Dearavid N. Harrington Challengerhies, MD  ADMITTING PHYSICIAN: Crissie FiguresEdavally N. Swanson Farnell, MD   CHIEF COMPLAINT: Redness, swelling, and pain of both lower extremities ongoing for the past few days.    HISTORY OF PRESENT ILLNESS: [name] is a pleasant 81 year old Caucasian female with a past medical history significant for hypertension, chronic anemia, hypothyroidism, sick sinus syndrome, status post permanent pacemaker, B12 deficiency, glaucoma, history of GERD, hard of hearing, and right eye blindness, presents to the Emergency Room accompanied by her home health aide with complaints of ongoing redness, pain, and swelling of bilateral lower extremities for the past few days, which kind of worsened in the past 3 to 4 days. She lives alone at her home with the help of home health assistant, Erie NoeVanessa, who is present with her at the present time. No fever. No chest pain. No cough. She did have some mild dizziness, but no shortness of breath. No nausea. No vomiting or diarrhea. No urinary symptoms. According to the patient's home health aide, the patient is ambulatory with assistance with a walker at home and is dependent on self-care needs. In the Emergency Room, the patient was evaluated by the ED physician and was noted to have bilateral lower extremity cellulitis. Blood cultures were obtained and she was started on IV antibiotics, namely IV vancomycin and ceftriaxone. The patient is lying in the bed, not in any acute distress. States that she is having bilateral leg pains and redness. She states that she wants to go home and take care of herself at home. Denies any complaints such as chest pain, shortness of breath, nausea, vomiting, diarrhea, abdominal pain. The patient is hard of hearing and is not a good historian, but able to communicate to a certain  extent. According to the old chart, the patient is a DNR, which is conformed by the home health aide who is present with the patient at the bedside now.   PAST MEDICAL HISTORY:  1.  Hypertension.  2.  Chronic anemia.  3.  Neuropathy with B12 deficiency under the care of hematology/oncology.  4.  Sick sinus syndrome, status post permanent pacemaker.  5.  Hypothyroidism.  6.  Glaucoma.  7.  B12 deficiency.  8.  Gastroesophageal reflux disease.  9.  Hard of hearing.   10.  Right eye blindness.   PAST SURGICAL HISTORY:  1.  Right eye surgery for glaucoma.  2.  Permanent pacemaker for sick sinus syndrome.  3.  Cataract surgery.   ALLERGIES:  1.  PENICILLIN.  2.  DOXYCYCLINE.  3.  PRELONE.  4.  XALATAN.   5.  MACRODANTIN.  6.  ALL CILLINS.  HOME MEDICATIONS: 1.  Aspirin enteric-coated 81 mg tablet 1 tablet orally once a day.  2.  Furosemide 20 mg tablet 1 tablet orally once a day.  3.  Levothyroxine 125 mcg 1 tablet orally once a day.  4.  Triamcinolone topical 0.1% topical cream, apply topically daily to affected area 2 times a day as needed.    SOCIAL HISTORY: The patient lives alone by herself. She is being taken care of by a home health aide, Erie NoeVanessa, who is with her at this time. No history of smoking, alcohol, or drug usage.   FAMILY HISTORY: Unknown and noncontributory according to the old chart. Not able to obtain from the patient at  this time.   REVIEW OF SYSTEMS:  CONSTITUTIONAL: Negative for fever, weakness, general weakness.  EYES: Right eye blind secondary to glaucoma.  EARS, NOSE, AND THROAT: Heart of hearing. No ear pain.  RESPIRATORY: No cough, No wheezing, no dyspnea, no painful respirations.  CARDIOVASCULAR: No chest pain. No palpitations. Does have some dizziness. No loss of consciousness.  GASTROINTESTINAL: No nausea, vomiting, diarrhea, abdominal pain, hematemesis, or melena.  GENITOURINARY: Negative for dysuria, hematuria, or frequency.  ENDOCRINE:  Negative for polyuria or nocturia. No heat or cold intolerance.  HEMATOLOGIC: Chronic anemia and B12 deficiency under the care of hematology/oncology.  INTEGUMENTARY: Bilateral lower extremity redness with a rash ongoing for the past few days.   MUSCULOSKELETAL: Mild arthritic pains on and off.  NEUROLOGICAL: Negative for focal weakness. No numbness. No history of CVA, TIA, or seizure disorder. History of neuropathy secondary to B12 deficiency under the care of hematology/oncology.  PSYCHIATRIC: Negative for anxiety, insomnia, or depression.   PHYSICAL EXAMINATION:  VITAL SIGNS: Temperature 97.8 degrees Fahrenheit, pulse rate 65 per minute, respirations 18 per minute, blood pressure on arrival 188/79, current blood pressure is 163/72, O2 saturation is 97% on room air.  GENERAL: Elderly lady well-nourished, alert, awake, and oriented x 3, in no acute distress.  HEAD: Atraumatic, normocephalic.  EYES: Right eye absent.  NOSE: No nasal lesions. No drainage.  EARS: No drainage.  ORAL CAVITY: No mucosal lesions. No exudates.  NECK: Supple. No JVD. No thyromegaly. No carotid bruit. Range of motion of neck movements normal.  RESPIRATORY: Good respiratory effort. Not using any accessory muscles of respiration. Bilateral vesicular breath sounds present. No rales or rhonchi.  CARDIOVASCULAR: S1, S2 regular. No murmurs, gallops, or clicks appreciated. Pedal pulses are equal at carotid, femoral, and pedal pulses. Pedal edema 1+ bilaterally present.  GASTROINTESTINAL: Abdomen is soft, nontender. No hepatosplenomegaly. No tenderness. No rigidity. No guarding. Bowel sounds equal and present in all 4 quadrants.  GENITOURINARY: Deferred.  MUSCULOSKELETAL: Gait not tested. No tenderness or effusion of the joints. Range of motion of joints adequate. Strength and tone equal bilaterally.  SKIN: Bilateral lower extremity redness with excoriation of skin present.  LYMPHATIC: No cervical lymphadenopathy.  VASCULAR:  Good dorsalis pedis and posterior pulses.  NEUROLOGICAL: Alert, awake, and oriented x 3. Cranial nerves II through XII grossly intact. DTRs 2+ bilaterally and symmetrical present. Motor strength 4/4 bilaterally in upper and lower extremities and equal.  PSYCHIATRIC: Judgment and insight are adequate. Alert and oriented x 3. Memory and mood within normal limits.   LABORATORY DATA: Serum glucose 107. BNP 1438. BUN 11, creatinine 0.85, sodium 136, potassium 4.1, chloride 103, bicarbonate 29, calcium 8.3. Troponin less than 0.02. WBC 4.5, hemoglobin 9.8, hematocrit 30.0, platelet count 209,000.   IMAGING STUDY: Chest x-ray:  1.  Small right pleural effusion and associated right basilar atelectasis.  2.  Otherwise stable chronic parenchymal changes.    EKG: Atrial fibrillation with ventricular rate of 68 beats per minute. No acute ST-T changes. Basically is a poor baseline, so unable to comment on the rhythm.   ASSESSMENT AND PLAN: Ms. Rudnick is a pleasant 81 year old Caucasian female with a past medical history significant for hypertension, B12 neuropathy, chronic anemia, sick sinus syndrome, status post permanent pacemaker, history of hypothyroidism, glaucoma, gastroesophageal reflux disease, hard of hearing, and right eye blindness, presents to the Emergency Room with complaints of redness, swelling, and pain of bilateral lower extremities, ongoing for the past few days.   1.  Bilateral lower extremity cellulitis  ongoing for the past few days. Plan: Admit to medical floor. Blood cultures. Continue intravenous antibiotics, vancomycin and ceftriaxone. Rest, elevation of the legs. Follow up with the wound care nurse as needed.  2.  Hypertension, controlled on home medications. Continue same.  3.  Chronic anemia, under the care of hematology/oncology. Hemoglobin and hematocrit mildly low but stable. Continue monitoring.  4.  Hypothyroidism, on levothyroxine, stable. Continue same.  5.  Sick sinus  syndrome, status post permanent pacemaker, stable clinically. Continue monitoring.  6.  Chronic neuropathy with B12 deficiency, under the care of hematology/oncology. Continue care per hematology/oncology.  7.  History of gastroesophageal reflux disease, stable on proton pump inhibitor. Continue same.  8.  Hard of hearing and right eye blindness, needs supportive care and assistance in activities of daily living.  9.  Deep vein thrombosis prophylaxis. Subcutaneous Lovenox.  10.  Gastrointestinal prophylaxis. Protonix.   CODE STATUS: The patient is DNR per old chart, which was conformed with the home health aide, who is present with the patient at this time.   TIME SPENT: 55 minutes.    ____________________________ Crissie Figures, MD enr:ts D: 04/16/2014 02:04:00 ET T: 04/16/2014 02:22:43 ET JOB#: [id number]  cc: Crissie Figures, MD, <Dictator> Neomia Dear. Harrington Challenger, MD Crissie Figures MD ELECTRONICALLY SIGNED 04/16/2014 20:42

## 2014-09-09 NOTE — Discharge Summary (Signed)
PATIENT NAME:  Kiara Sanders, Kiara Sanders MR#:  [id number] DATE OF BIRTH:  02/12/1911  DATE OF ADMISSION:  04/16/2014 DATE OF DISCHARGE:  04/17/2014  ADMISSION DIAGNOSIS: Bilateral lower extremity cellulitis.   DISCHARGE DIAGNOSES: 1.  Bilateral lower extremity cellulitis.  2.  History of sick sinus syndrome, status post pacemaker. 3.  History of essential hypertension.  4.  History of hypothyroidism.   CONSULTATIONS: None.   PERTINENT LABORATORIES AT DISCHARGE: White blood cells 5.6, hemoglobin 9.6, hematocrit 29.7, platelets are 196,000.   Blood culture negative to date.   DISCHARGE PHYSICAL EXAMINATION: VITAL SIGNS: Temperature is 97.6, pulse is 68, respirations 17, blood pressure 157/60, 92% on room air.  GENERAL: The patient was alert, but not oriented.  CARDIOVASCULAR: Regular rate and rhythm. There is a 2/6 systolic ejection murmur heard best at the right sternal border without radiation.  LUNGS: Clear to auscultation. There are no crackles, rales, rhonchi, or wheezing. Normal to percussion. GASTROINTESTINAL: Bowel sounds are positive. Nontender and nondistended. No hepatosplenomegaly. No rebound or guarding.  EXTREMITIES: The patient has bilateral cellulitis, which has improved. She has erythema, but no warmth or tenderness. She has really dry skin and with thickening of her skin in her lower extremities.   HOSPITAL COURSE: A 81 year old female who was admitted on 11/28 for redness, swelling of her lower extremities. For further details, please refer to the H and P.   1.  Bilateral lower extremity cellulitis. The patient's cellulitis had been going on for the past several days prior to admission. She was placed on IV antibiotics including vancomycin and ceftriaxone. Blood cultures were negative to date. Her cellulitis has improved. I suspect that she may have some underlying peripheral vascular disease; however, at age 81 and her comorbidities, it is not likely that she would do well  with any type of invasive procedure. We will continue antibiotics. Due to her agitation and confusion, she will be discharged with Levaquin, which is a suspension.  2  Essential hypertension. The patient ill continue on Norvasc.  3.  Chronic anemia under the care of hematology/oncology.  4.  History of hyperthyroidism on levothyroxine.  5 Sick sinus syndrome status post permanent pacemaker.  6.  History of hearing and right eye blindness.  DISCHARGE MEDICATIONS: 1.  Aspirin 81 mg daily. 2.  Levothyroxine 125 mcg daily.  3.  Norvasc 10 mg daily.  4.  Levaquin 30 mL q.24 hours x7 days.  DISPOSITION: Discharge home with home health, physical therapy, and nurse.   DISCHARGE DIET: Regular diet with Ensure daily.   DISCHARGE ACTIVITY: As tolerated. The patient can follow up with Dr. Mickey Farberavid Thies in 1 week.   TIME SPENT: 35 minutes.   ____________________________ Kiara ContesSital P. Juliene PinaMody, MD spm:sw D: 04/17/2014 14:33:54 ET T: 04/17/2014 16:20:10 ET JOB#: [id number]  cc: Kiara Fenner P. Juliene PinaMody, MD, <Dictator> Kiara ContesSITAL P Nichoel Digiulio MD ELECTRONICALLY SIGNED 04/17/2014 20:56

## 2014-09-09 NOTE — Discharge Summary (Signed)
PATIENT NAME:  Kiara Sanders, Kiara Sanders MR#:  [id number] DATE OF BIRTH:  1910-09-13  DATE OF ADMISSION:  05/01/2014 DATE OF DISCHARGE:  05/02/2014  ADMITTING DIAGNOSIS: Shortness of breath.   DISCHARGE DIAGNOSES: 1. Acute exacerbation of chronic diastolic heart failure.  2. Acute respiratory failure with hypoxia.  3. Hypothyroidism.  4. Hypertension.  5. Dementia.   CONSULTATIONS: None.   IMAGING: 1. Chest x-ray December 14 shows enlargement of cardiac silhouette with pulmonary vascular congestion. Increase right basilar effusion and atelectasis, persistent mild left basilar atelectasis.  2. A 2-D echocardiogram performed December 15 shows elevated ventricular ejection fraction at 75%. Normal global left ventricular systolic function. Mildly dilated left atrium. Mildly to moderately regurgitant mitral valve.   HISTORY OF PRESENT ILLNESS: This 81 year old woman with a past medical history of dementia, presents with shortness of breath. The patient is unable to provide a history and is very hard of hearing. History provided by her daughter and caretaker. Chest x-ray shows enlargement of cardiac silhouette with vascular congestion. Elevated BNP and lower extremity edema. Pulse oximetry 88%. Hospitalist service is asked to admit for further evaluation and treatment.   HOSPITAL COURSE: By problem:  1. Acute exacerbation of diastolic heart failure: The patient has preserved ejection fraction and mild diastolic dysfunction on 2-D echocardiogram. Her symptoms resolved with diuresis. She will continue on Lasix with a low-salt diet at home.  2. Acute respiratory failure with hypoxia: Presenting oxygenation 88% on room air. The patient had 2 liters supplemental oxygen throughout her hospitalization. On the morning of admission, she did seem fairly comfortable on room air with oxygenation in the 80s and 90s. However, on exertion, her oxygen saturation decreases quickly into the 70s. She is being discharged  with 2 liters via nasal cannula chronic oxygen. Her home health assistant states that she has a pulse oximeter. She is discharged with recommendation to try to maintain oxygen saturations around 90. 3. Hypothyroidism: Continue levothyroxine.  4. Hypertension: Blood pressure was stable throughout hospitalization.  5. Dementia with poor quality of life: This is stable.   DISPOSITION: The patient is a DNR. I discussed possibility of hospice referral with the patient's daughter, who stated that the patient would not accept hospice due to paranoia as a component of her dementia. She will continue to have home health as she does already.   LABORATORY DATA: Sodium 139, potassium 3.9, chloride 102, bicarbonate 29, BUN 16, creatinine 0.96, glucose 101. LFTs normal. Troponin negative x 3. White blood cells 3.5, hemoglobin 9.7, platelets 154,000, MCV 98.   PHYSICAL EXAMINATION:  VITAL SIGNS: Temperature 97.7, pulse 62, respirations 19, blood pressure 137/65, oxygenation 90% on 2 liters.  GENERAL: No acute distress.   CARDIOVASCULAR: Regular rate and rhythm. There is a 4/6 systolic ejection murmur, no peripheral edema. Peripheral pulses are 1+.   RESPIRATORY: Scattered wheezes, good air movement, no respiratory distress.   DISPOSITION: The patient is discharged home with home health nursing and a personal care assistant.   CONDITION ON DISCHARGE: This patient is frail and has multiple medical conditions as well as very advanced age. She is in guarded condition. Family has declined hospice referral.   DISCHARGE MEDICATIONS: 1. Aspirin 81 mg 1 tablet daily.  2. Levothyroxine 125 mcg 1 tablet daily.  3. Lasix 20 mg 1 tablet daily.  4. Amlodipine 5 mg 1 tablet daily.   DISCHARGE INSTRUCTIONS: DIET: Heart healthy, low sodium diet.   ACTIVITY: No restrictions.   HOME HEALTH: The patient will be on 1 to 2  liters via nasal cannula with instructions to keep oxygen saturations above 88%.   TIMEFRAME FOR  FOLLOWUP: Follow up within 1 to 2 weeks with Dr. Harrington Challenger, primary care.   TIME SPENT ON DISCHARGE: 35 minutes.      ____________________________ Ena Dawley. Clent Ridges, MD cpw:TT D: 05/06/2014 15:27:19 ET T: 05/06/2014 16:01:28 ET JOB#: [id number]  cc: Santina Evans P. Clent Ridges, MD, <Dictator> Neomia Dear. Harrington Challenger, MD Gale Journey MD ELECTRONICALLY SIGNED 05/10/2014 9:10
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
## 2014-09-09 NOTE — H&P (Signed)
PATIENT NAME:  Kiara Sanders, REILY MR#:  [id number] DATE OF BIRTH:  05/30/1910  PRIMARY CARE PHYSICIAN:  Neomia Dear. Harrington Challenger, MD  CHIEF COMPLAINT: Brought in for shortness of breath.   HISTORY OF PRESENT ILLNESS: This is 81 year old female with history of dementia, unable to get any good history from her secondary to dementia and hard of hearing. The patient denies all symptoms. Denies even having shortness of breath. In the ER, she is using accessory muscles to breathe. Chest x-ray showed pleural effusion, enlargement of the cardiac silhouette with pulmonary vascular congestion. She also has lower extremity edema and elevated BNP. Hospitalist services were contacted for acute respiratory failure with pulse oximetry of 88% on room air.   PAST MEDICAL HISTORY: Hypothyroidism, hypertension, edema, dementia.   PAST SURGICAL HISTORY: Unknown. From old chart, right eye surgery for glaucoma, permanent pacemaker, and cataract surgery.   ALLERGIES: PENICILLIN, DOXYCYCLINE, XALATAN, MACRODANTIN AND ALL CILLINS.   MEDICATIONS AT HOME:  Include amlodipine 10 mg daily, aspirin 81 mg daily, Lasix 20 mg daily, levothyroxine 125 mcg daily.   SOCIAL HISTORY: Has 24/7 caregiver. No smoking. No alcohol. No drug use. Used to work in a [employer].   FAMILY HISTORY: Unable to obtain from patient.  REVIEW OF SYSTEMS: Unable to obtain secondary to hard of hearing and dementia.   PHYSICAL EXAMINATION: VITAL SIGNS: Temperature 97.5, pulse 60, respirations 18 using accessory muscles, blood pressure 134/71, pulse oximetry 94% on oxygen, pulse oximetry was 88% on room air.  GENERAL: Slight respiratory distress using accessory muscles to breathe.  EYES: Conjunctivae and lids normal. Pupils equal, round, reactive to light. Unable to test extraocular muscles.  EARS, NOSE, MOUTH, AND THROAT: Nasal mucosa: No erythema. Throat: No erythema. No exudate seen. Lips and gums: No lesions.  NECK: Positive for JVD. No bruits. No  lymphadenopathy. No thyromegaly. No thyroid nodules palpated.  RESPIRATORY: Positive use of accessory muscles to breathe. Decreased breath sounds bilaterally. Positive rales at bilateral bases.  CARDIOVASCULAR SYSTEM: S1, S2 normal. No gallops, rubs heard, 2/6 systolic ejection murmur. Carotid upstroke 2+ bilaterally. No bruits. Dorsalis pedis pulses difficult to palpate secondary to 3+ edema.  ABDOMEN: Soft, nontender. No organomegaly, splenomegaly. Normoactive bowel sounds. No masses felt.  LYMPHATIC: No lymph nodes in the neck.  MUSCULOSKELETAL: No clubbing, 2+ edema. No cyanosis.  SKIN: No ulcers or lesions seen. Lower extremities covered with Unna boots.  NEUROLOGIC: Cranial nerves II through XII grossly intact. Deep tendon reflexes 1/2+ bilateral lower extremity.  PSYCHIATRIC: The patient is alert and oriented to person.   LABORATORY AND RADIOLOGICAL DATA: Chest x-ray, enlargement of the cardiac silhouette with pulmonary vascular congestion, increased right basilar effusions, and atelectasis. White blood count 3.6, H and H 10.0 and 31.0, platelet count of 156,000. Troponin negative. Glucose 96, BUN 18, creatinine 0.94. Sodium 139, potassium 4.4, chloride 104, CO2 of 33, calcium 8.4. Liver function tests normal range. BNP 2010.   EKG: Paced, 61 beats per minute.   ASSESSMENT AND PLAN: 1.  Acute respiratory failure with pulse oximetry of 88% on room air, using accessory muscles to breathe. We will give oxygen supplementation.  2.  Acute congestive heart failure with anasarca. Will give intravenous Lasix 40 mg intravenous  now and 20 mg intravenous daily and continue to monitor. Add low dose metoprolol. Discontinue Norvasc. Will obtain an echocardiogram. The patient made a DO NOT RESUSCITATE in speaking with the daughter.  3.  Hypothyroidism. Continue levothyroxine.  4.  Hypertension. Blood pressure stable.  5.  Dementia with poor quality of life. Would be a candidate for hospice at home if  survives the hospital stay.   TIME SPENT ON ADMISSION: 50 minutes.   CODE STATUS: The patient is a DO NOT RESUSCITATE.   Overall prognosis poor.    ____________________________ Herschell Dimesichard J. Renae GlossWieting, MD rjw:LT D: 05/01/2014 19:51:32 ET T: 05/01/2014 20:17:03 ET JOB#: [id number]  cc: Herschell Dimesichard J. Renae GlossWieting, MD, <Dictator> Neomia Dearavid N. Harrington Challengerhies, MD Salley ScarletICHARD J Swannie Milius MD ELECTRONICALLY SIGNED 05/10/2014 15:43

## 2015-02-04 ENCOUNTER — Encounter: Payer: Self-pay | Admitting: Emergency Medicine

## 2015-02-04 ENCOUNTER — Emergency Department
Admission: EM | Admit: 2015-02-04 | Discharge: 2015-02-04 | Disposition: A | Discharge status: Home / Self Care | Payer: Medicare Other | Attending: Emergency Medicine | Admitting: Emergency Medicine

## 2015-02-04 DIAGNOSIS — N390 Urinary tract infection, site not specified: Secondary | ICD-10-CM | POA: Insufficient documentation

## 2015-02-04 DIAGNOSIS — E860 Dehydration: Secondary | ICD-10-CM | POA: Insufficient documentation

## 2015-02-04 DIAGNOSIS — R2243 Localized swelling, mass and lump, lower limb, bilateral: Secondary | ICD-10-CM | POA: Diagnosis not present

## 2015-02-04 DIAGNOSIS — R630 Anorexia: Secondary | ICD-10-CM | POA: Diagnosis not present

## 2015-02-04 DIAGNOSIS — Z880 Allergy status to penicillin: Secondary | ICD-10-CM | POA: Diagnosis not present

## 2015-02-04 DIAGNOSIS — R531 Weakness: Secondary | ICD-10-CM | POA: Diagnosis present

## 2015-02-04 HISTORY — DX: Disorder of thyroid, unspecified: E07.9

## 2015-02-04 LAB — COMPREHENSIVE METABOLIC PANEL
ALBUMIN: 2.2 g/dL — AB (ref 3.5–5.0)
ALT: 12 U/L — ABNORMAL LOW (ref 14–54)
ANION GAP: 3 — AB (ref 5–15)
AST: 27 U/L (ref 15–41)
Alkaline Phosphatase: 121 U/L (ref 38–126)
BILIRUBIN TOTAL: 1.1 mg/dL (ref 0.3–1.2)
BUN: 32 mg/dL — ABNORMAL HIGH (ref 6–20)
CALCIUM: 8.3 mg/dL — AB (ref 8.9–10.3)
CO2: 29 mmol/L (ref 22–32)
Chloride: 103 mmol/L (ref 101–111)
Creatinine, Ser: 1.76 mg/dL — ABNORMAL HIGH (ref 0.44–1.00)
GFR calc non Af Amer: 22 mL/min — ABNORMAL LOW (ref >60)
GFR, EST AFRICAN AMERICAN: 26 mL/min — AB (ref >60)
GLUCOSE: 91 mg/dL (ref 65–99)
POTASSIUM: 5.2 mmol/L — AB (ref 3.5–5.1)
SODIUM: 135 mmol/L (ref 135–145)
TOTAL PROTEIN: 6.4 g/dL — AB (ref 6.5–8.1)

## 2015-02-04 LAB — CBC WITH DIFFERENTIAL/PLATELET
BASOS ABS: 0 10*3/uL (ref 0–0.1)
BASOS PCT: 1 %
EOS PCT: 2 %
Eosinophils Absolute: 0.1 10*3/uL (ref 0–0.7)
HCT: 33.6 % — ABNORMAL LOW (ref 35.0–47.0)
Hemoglobin: 11.4 g/dL — ABNORMAL LOW (ref 12.0–16.0)
Lymphocytes Relative: 35 %
Lymphs Abs: 1.9 10*3/uL (ref 1.0–3.6)
MCH: 31.1 pg (ref 26.0–34.0)
MCHC: 33.9 g/dL (ref 32.0–36.0)
MCV: 91.9 fL (ref 80.0–100.0)
MONO ABS: 0.5 10*3/uL (ref 0.2–0.9)
Monocytes Relative: 9 %
Neutro Abs: 2.8 10*3/uL (ref 1.4–6.5)
Neutrophils Relative %: 53 %
PLATELETS: 195 10*3/uL (ref 150–440)
RBC: 3.66 MIL/uL — ABNORMAL LOW (ref 3.80–5.20)
RDW: 14.3 % (ref 11.5–14.5)
WBC: 5.3 10*3/uL (ref 3.6–11.0)

## 2015-02-04 LAB — URINALYSIS COMPLETE WITH MICROSCOPIC (ARMC ONLY)
BILIRUBIN URINE: NEGATIVE
Glucose, UA: NEGATIVE mg/dL
KETONES UR: NEGATIVE mg/dL
NITRITE: NEGATIVE
PROTEIN: 100 mg/dL — AB
SPECIFIC GRAVITY, URINE: 1.015 (ref 1.005–1.030)
pH: 5 (ref 5.0–8.0)

## 2015-02-04 MED ORDER — CEPHALEXIN 500 MG PO CAPS
500.0000 mg | ORAL_CAPSULE | Freq: Three times a day (TID) | ORAL | Status: AC
Start: 2015-02-04 — End: ?

## 2015-02-04 MED ORDER — SODIUM CHLORIDE 0.9 % IV BOLUS (SEPSIS)
500.0000 mL | Freq: Once | INTRAVENOUS | Status: AC
  Administered 2015-02-04: 500 mL via INTRAVENOUS

## 2015-02-04 MED ORDER — DEXTROSE 5 % IV SOLN
1.0000 g | Freq: Once | INTRAVENOUS | Status: AC
  Administered 2015-02-04: 1 g via INTRAVENOUS
  Filled 2015-02-04: qty 10

## 2015-02-04 NOTE — ED Notes (Signed)
Brought in for caregiver   Decreased appetite   Weakness and swelling to left leg

## 2015-02-04 NOTE — ED Provider Notes (Addendum)
[facility] Emergency Department Provider Note  ____________________________________________  Time seen: 12:50 PM  I have reviewed the triage vital signs and the nursing notes.   HISTORY  Chief Complaint Weakness decreased appetite    HPI Kiara Sanders is a 81 y.o. female who has been brought to the emergency department because she has been eating less food the past 2-3 days. She is drinking fluids without a problem. She is escorted by her live-in caregiver. Her daughter was on the telephone with the caregiver during my interview.  This elderly patient is alert and communicative, although there are some limitations with her difficulty hearing. She denies any pain. When asked if she has a good appetite, she reports "it depends on what they're serving". No history of nausea, vomiting, abdominal pain, or diarrhea.  The caretaker does report that there is a strong odor to her urine.  The patient has a chronic problem with her bilateral distal legs. She has chronic edema, with some noted erythema. The caretaker reports that the legs are better now than they have been since they started using an antibiotic ointment prescribed by the patient's primary physician, Dr. Audelia Acton.   Past Medical History  Diagnosis Date  . Thyroid disease     There are no active problems to display for this patient.   History reviewed. No pertinent past surgical history.  Current Outpatient Rx  Name  Route  Sig  Dispense  Refill  . cephALEXin (KEFLEX) 500 MG capsule   Oral   Take 1 capsule (500 mg total) by mouth 3 (three) times daily.   15 capsule   0     Allergies Codeine; Doxycycline; Macrodantin; and Penicillins  No family history on file.  Social History Social History  Substance Use Topics  . Smoking status: Never Smoker   . Smokeless tobacco: None  . Alcohol Use: No    Review of Systems  Constitutional: Negative for fever. Positive for decreased by  mouth intake. ENT: Negative for sore throat. Cardiovascular: Negative for chest pain. Respiratory: Negative for shortness of breath. Gastrointestinal: Negative for abdominal pain, vomiting and diarrhea. Genitourinary: Negative for dysuria. Musculoskeletal: No myalgias or injuries. Skin: erythema in distal legs bilaterally. Improved per caregiver.. Neurological: caregiver reports history of sundowning, but no acute changes.   10-point ROS otherwise negative.  ____________________________________________   PHYSICAL EXAM:  VITAL SIGNS: ED Triage Vitals  Enc Vitals Group     BP 02/04/15 1229 86/61 mmHg     Pulse Rate 02/04/15 1229 102     Resp 02/04/15 1229 18     Temp 02/04/15 1229 97.4 F (36.3 C)     Temp Source 02/04/15 1229 Oral     SpO2 02/04/15 1229 99 %     Weight 02/04/15 1229 107 lb (48.535 kg)     Height 02/04/15 1229 5' (1.524 m)     Head Cir --      Peak Flow --      Pain Score 02/04/15 1230 2     Pain Loc --      Pain Edu? --      Excl. in GC? --     Constitutional:  Alert, frail, elderly appearing female in no acute distress. Noted limited hearing. ENT   Head: Normocephalic and atraumatic.      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 Cardiovascular: Normal rate at 74, regular rhythm, no murmur noted Respiratory:  Normal respiratory effort, no tachypnea.  Breath sounds are clear and equal bilaterally.  Gastrointestinal: Soft and nontender. No distention.  Back: No muscle spasm, no tenderness, no CVA tenderness. Musculoskeletal: No deformity noted. Nontender. Appearance of chronic edema in lower extremities. Neurologic:  Normal speech and language. No gross focal neurologic deficits are appreciated.  Skin:  Skin is warm. There are plaque formations on her lower legs with mild to moderate edema and induration consistent with chronic edema. Psychiatric: patient is alert, communicative, though with some limitations due to hardness of  hearing. She reports she wants to go home. ____________________________________________    LABS (pertinent positives/negatives)  Labs Reviewed  CBC WITH DIFFERENTIAL/PLATELET - Abnormal; Notable for the following:    RBC 3.66 (*)    Hemoglobin 11.4 (*)    HCT 33.6 (*)    All other components within normal limits  COMPREHENSIVE METABOLIC PANEL - Abnormal; Notable for the following:    Potassium 5.2 (*)    BUN 32 (*)    Creatinine, Ser 1.76 (*)    Calcium 8.3 (*)    Total Protein 6.4 (*)    Albumin 2.2 (*)    ALT 12 (*)    GFR calc non Af Amer 22 (*)    GFR calc Af Amer 26 (*)    Anion gap 3 (*)    All other components within normal limits  URINALYSIS COMPLETEWITH MICROSCOPIC ([facility] ONLY) - Abnormal; Notable for the following:    Color, Urine YELLOW (*)    APPearance TURBID (*)    Hgb urine dipstick 2+ (*)    Protein, ur 100 (*)    Leukocytes, UA 3+ (*)    Bacteria, UA MANY (*)    Squamous Epithelial / LPF 6-30 (*)    All other components within normal limits  URINE CULTURE     ____________________________________________  ____________________________________________   INITIAL IMPRESSION / ASSESSMENT AND PLAN / ED COURSE  Pertinent labs & imaging results that were available during my care of the patient were reviewed by me and considered in my medical decision making (see chart for details).  Communicative 81 year old female with some limitations. The caregiver has brought her in because she is not eating as much, but there are no reports of other acute issues. She is drinking fluids adequately. She has an overall stable and benign exam area and we will assess basic lab tests and a urinalysis. If these are negative, we will allow her to return home to continue her ongoing care. Any notable abnormal results will be addressed.  ----------------------------------------- 2:23 PM on 02/04/2015 -----------------------------------------  Patient remained stable. BUN is 32  creatinine of 1.76. The BUN is typical for this patient. The creatinine is slightly elevated. We will treat her with 500 mL of normal saline. Urinalysis is still pending.  ----------------------------------------- 3:08 PM on 02/04/2015 -----------------------------------------  Urine results show white blood cells too numerous to count and red blood cells too numerous to count with 3+ leukocyte esterase, bacteria many, and turbid appearance. She has white blood cell clumps also.  With no fever and no white blood cell count, we will start her on an antibiotic and allow her to return home with care by her caregiver and following up with her primary doctor.  I have ordered ceftriaxone and will continue the patient on Keflex. She is allergic to penicillins, but there is a note in the system that says she can tolerate cephalosporins. Her reaction to penicillins was a rash and not anaphylaxis. She is allergic to multiple other antibiotics  and this seems like the most effective and reasonable course.   ____________________________________________   FINAL CLINICAL IMPRESSION(S) / ED DIAGNOSES  Final diagnoses:  Decreased appetite  UTI (lower urinary tract infection)  Mild dehydration      Darien Ramus, MD 02/04/15 1519  Darien Ramus, MD 02/04/15 1520

## 2015-02-04 NOTE — Discharge Instructions (Signed)
The urinalysis shows significant white blood cells consistent with a urinary tract infection. You were treated with ceftriaxone in the emergency department. Continue with Keflex 3 times a day for this urinary tract infection. You're also given 500 mL of normal saline for mild dehydration. Return to the emergency department if there is fever, increased weakness, or other urgent concerns.   Urinary Tract Infection A urinary tract infection (UTI) can occur any place along the urinary tract. The tract includes the kidneys, ureters, bladder, and urethra. A type of germ called bacteria often causes a UTI. UTIs are often helped with antibiotic medicine.  HOME CARE   If given, take antibiotics as told by your doctor. Finish them even if you start to feel better.  Drink enough fluids to keep your pee (urine) clear or pale yellow.  Avoid tea, drinks with caffeine, and bubbly (carbonated) drinks.  Pee often. Avoid holding your pee in for a long time.  Pee before and after having sex (intercourse).  Wipe from front to back after you poop (bowel movement) if you are a woman. Use each tissue only once. GET HELP RIGHT AWAY IF:   You have back pain.  You have lower belly (abdominal) pain.  You have chills.  You feel sick to your stomach (nauseous).  You throw up (vomit).  Your burning or discomfort with peeing does not go away.  You have a fever.  Your symptoms are not better in 3 days. MAKE SURE YOU:   Understand these instructions.  Will watch your condition.  Will get help right away if you are not doing well or get worse. Document Released: 10/22/2007 Document Revised: 01/28/2012 Document Reviewed: 12/04/2011 [facility] Patient Information 2015 [place], [place]. This information is not intended to replace advice given to you by your health care provider. Make sure you discuss any questions you have with your health care provider.

## 2015-02-04 NOTE — ED Notes (Signed)
MD at bedside. 

## 2015-02-07 LAB — URINE CULTURE: Culture: >=100000

## 2015-03-20 DEATH — deceased

## 2015-08-07 IMAGING — CT CT HEAD WITHOUT CONTRAST
3 of 5 series · 14 of 47 positions shown, 16 images · non-contrast
Comparison: 01/15/2013

CLINICAL DATA: Fall

EXAM:
CT HEAD WITHOUT CONTRAST
CT CERVICAL SPINE WITHOUT CONTRAST
TECHNIQUE: Multidetector CT imaging of the head and cervical spine was
performed following the standard protocol without intravenous
contrast. Multiplanar CT image reconstructions of the cervical spine
were also generated.

[Series 5: soft tissue · axial · 0.39mm/px · z∈[-331,-199]mm · 8 of 80 slices shown, 10 images]
[im 7/80  brain]
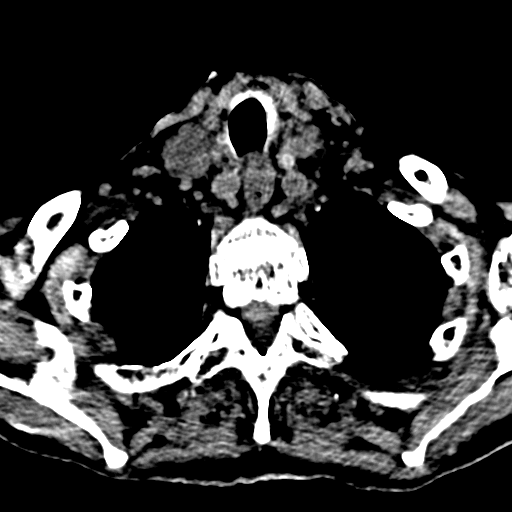
[im 7/80  bone]
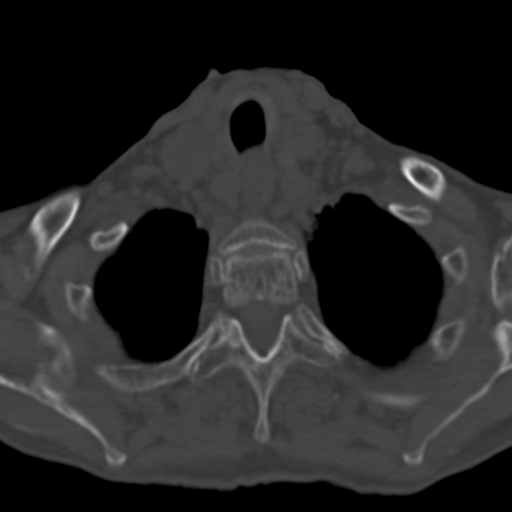
[im 20/80  brain]
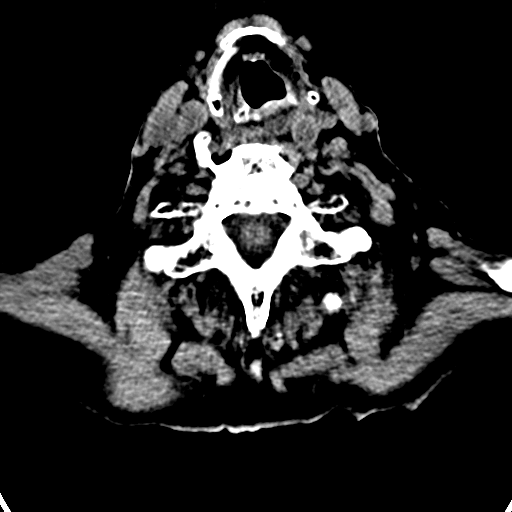
[im 27/80  brain]
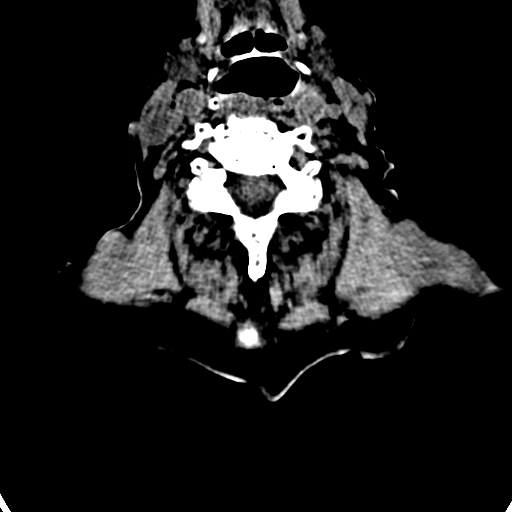
[im 33/80  brain]
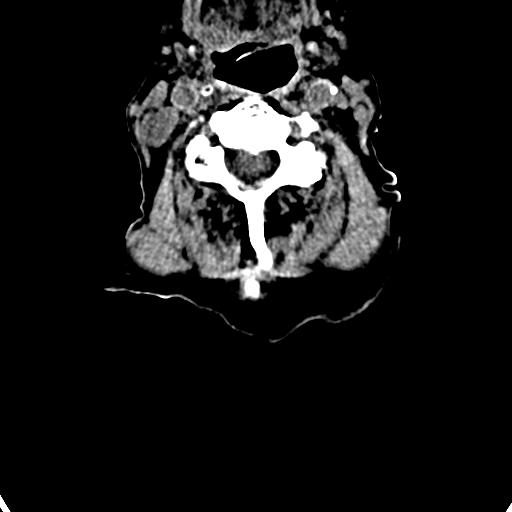
[im 47/80  brain]
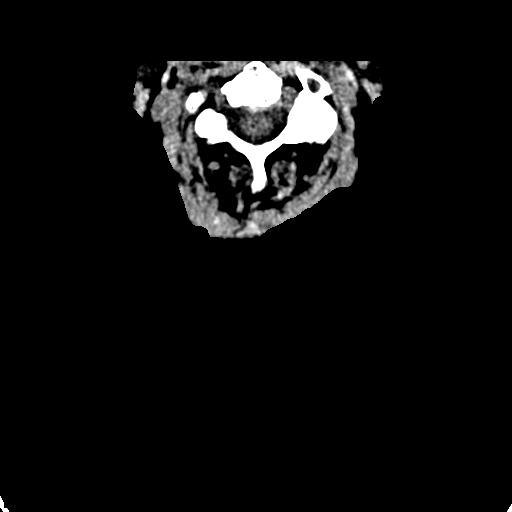
[im 47/80  bone]
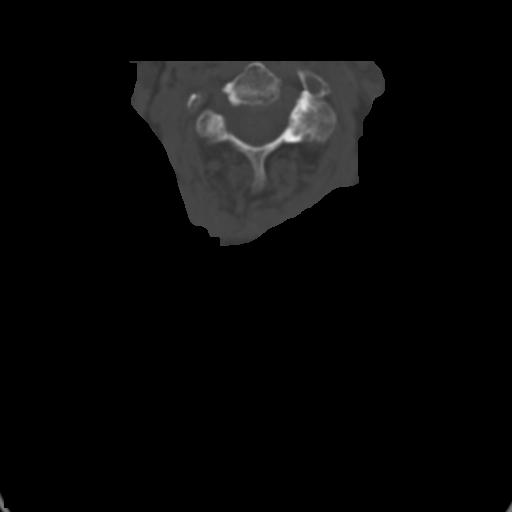
[im 53/80  brain]
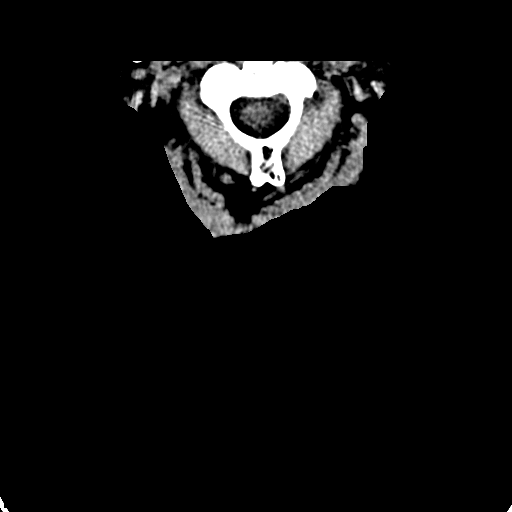
[im 60/80  brain]
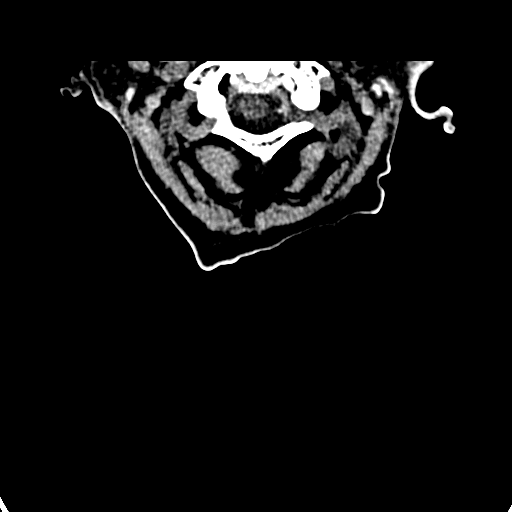
[im 73/80  brain]
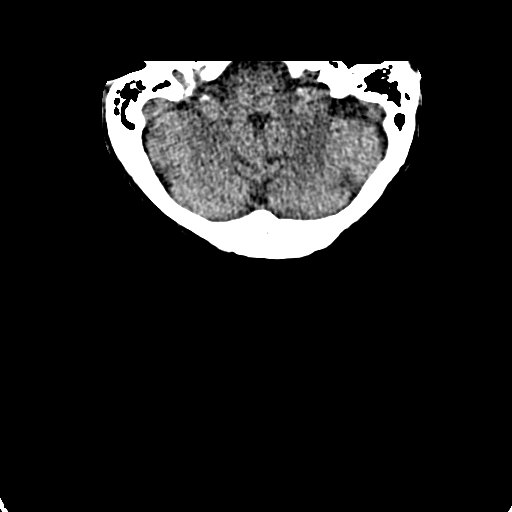

[Series 8: sagittal bone · sagittal · 0.26mm/px · 3 of 47 slices shown]
[im 16/47  brain]
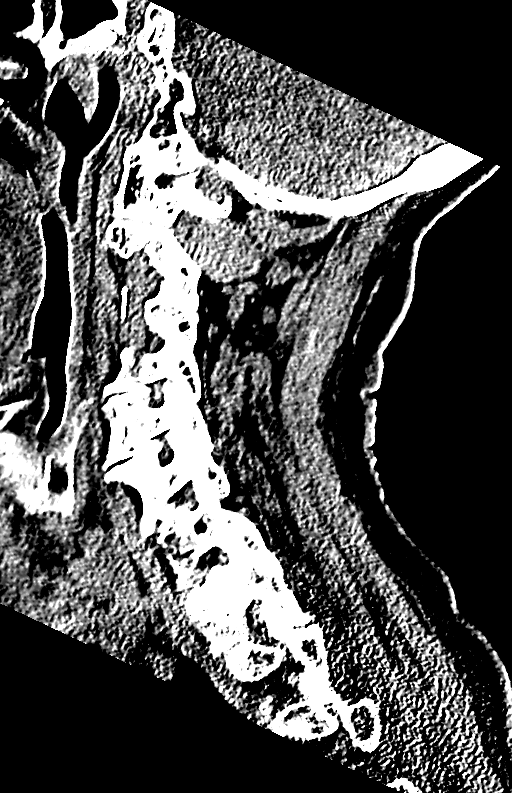
[im 24/47  brain]
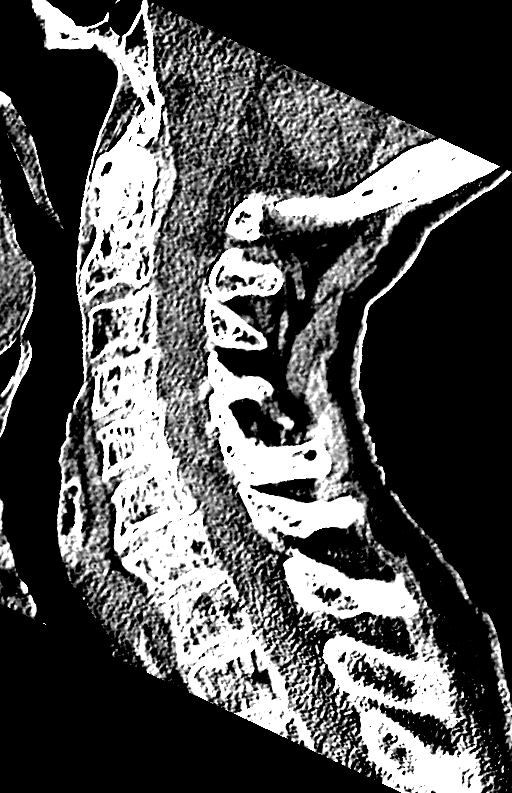
[im 31/47  brain]
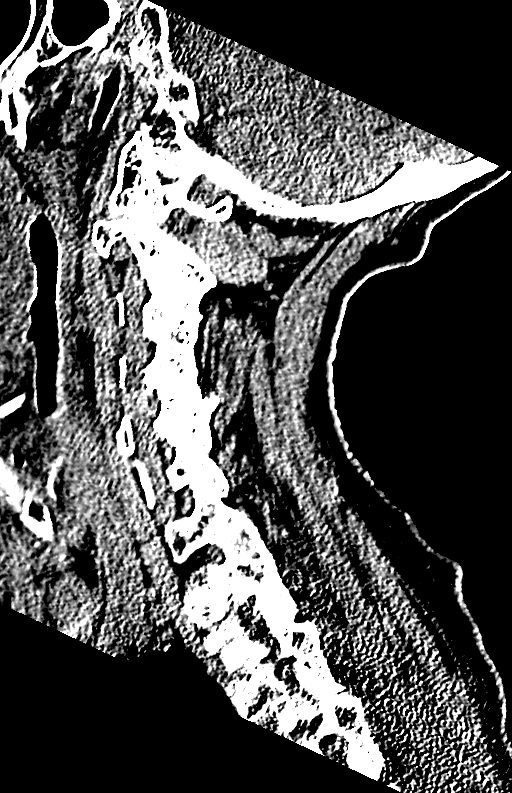

[Series 9: coronal bone · coronal · 0.31mm/px · 3 of 41 slices shown]
[im 14/41  brain]
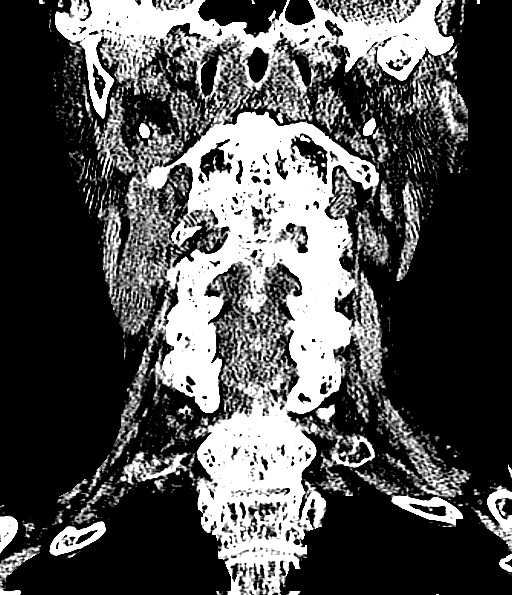
[im 18/41  brain]
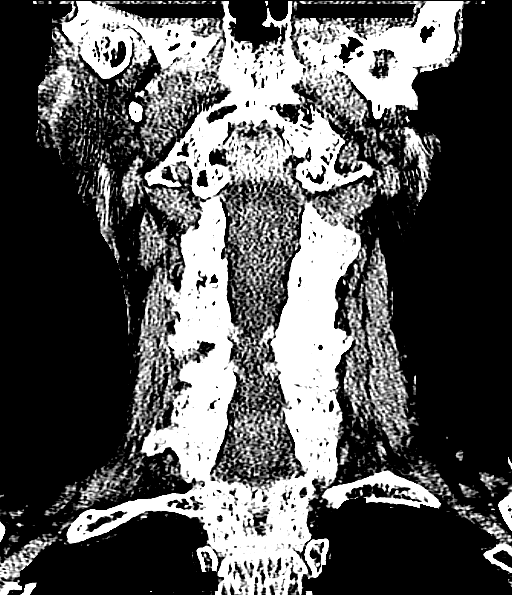
[im 23/41  brain]
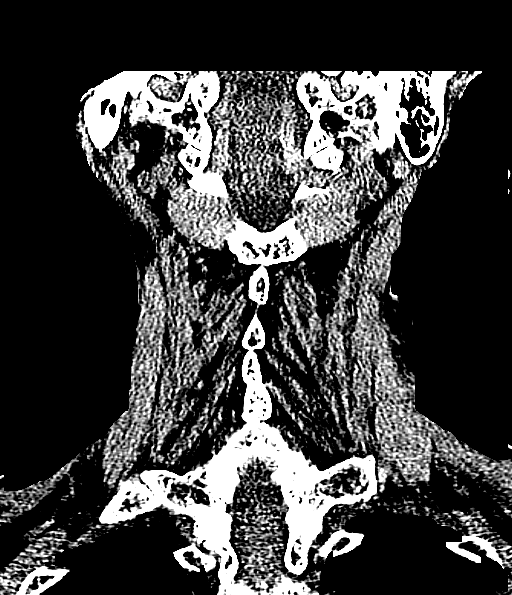

[14 of 47 positions shown; findings below may reference images not displayed]

FINDINGS: CT HEAD FINDINGS

Global atrophy. Chronic ischemic changes in the periventricular
white matter. Lacunar infarct in the left basal ganglia. No mass
effect, midline shift, or acute intracranial hemorrhage. Right
phthisis bulbi. Mastoid air cells clear. Intact cranium.

CT CERVICAL SPINE FINDINGS

No acute fracture. No dislocation. Degenerative changes as scattered
throughout the cervical spine. There is ankylosis of the right and
left C7-T1 facet joint. There is ankylosis of the right C3-4 facet
joint. Ankylosis of the left C2 through C4 facet joint. Osteopenia.
No obvious soft tissue injury. Emphysema at the lung apices.

Right internal jugular vein is lobulated and dilated at the thoracic
inlet on image 77.
IMPRESSION: No acute intracranial pathology.

No acute bony injury in the cervical spine. Chronic changes are
noted.
